# Patient Record
Sex: Male | Born: 1955 | Race: White | Hispanic: No | Marital: Married | Smoking: Never smoker
Health system: Southern US, Community
[De-identification: ages and names within clinical notes are randomized; demographics above are authoritative.]

## PROBLEM LIST (undated history)

## (undated) DIAGNOSIS — M5416 Radiculopathy, lumbar region: Secondary | ICD-10-CM

## (undated) DIAGNOSIS — K22719 Barrett's esophagus with dysplasia, unspecified: Secondary | ICD-10-CM

## (undated) DIAGNOSIS — M4690 Unspecified inflammatory spondylopathy, site unspecified: Secondary | ICD-10-CM

## (undated) DIAGNOSIS — M4316 Spondylolisthesis, lumbar region: Secondary | ICD-10-CM

## (undated) DIAGNOSIS — L409 Psoriasis, unspecified: Secondary | ICD-10-CM

## (undated) DIAGNOSIS — M1990 Unspecified osteoarthritis, unspecified site: Secondary | ICD-10-CM

## (undated) DIAGNOSIS — G629 Polyneuropathy, unspecified: Secondary | ICD-10-CM

## (undated) DIAGNOSIS — K219 Gastro-esophageal reflux disease without esophagitis: Secondary | ICD-10-CM

## (undated) DIAGNOSIS — M2012 Hallux valgus (acquired), left foot: Secondary | ICD-10-CM

## (undated) DIAGNOSIS — L4059 Other psoriatic arthropathy: Secondary | ICD-10-CM

## (undated) DIAGNOSIS — M2550 Pain in unspecified joint: Secondary | ICD-10-CM

## (undated) DIAGNOSIS — M2011 Hallux valgus (acquired), right foot: Secondary | ICD-10-CM

## (undated) DIAGNOSIS — G609 Hereditary and idiopathic neuropathy, unspecified: Secondary | ICD-10-CM

## (undated) DIAGNOSIS — M7751 Other enthesopathy of right foot: Secondary | ICD-10-CM

## (undated) DIAGNOSIS — M5116 Intervertebral disc disorders with radiculopathy, lumbar region: Secondary | ICD-10-CM

## (undated) DIAGNOSIS — M216X9 Other acquired deformities of unspecified foot: Secondary | ICD-10-CM

## (undated) DIAGNOSIS — Z201 Contact with and (suspected) exposure to tuberculosis: Secondary | ICD-10-CM

## (undated) DIAGNOSIS — E538 Deficiency of other specified B group vitamins: Secondary | ICD-10-CM

## (undated) DIAGNOSIS — E785 Hyperlipidemia, unspecified: Secondary | ICD-10-CM

## (undated) DIAGNOSIS — Z973 Presence of spectacles and contact lenses: Secondary | ICD-10-CM

## (undated) HISTORY — PX: TONSILLECTOMY: SUR1361

## (undated) HISTORY — PX: OTHER SURGICAL HISTORY: SHX169

## (undated) HISTORY — PX: BACK SURGERY: SHX140

## (undated) HISTORY — DX: Hyperlipidemia, unspecified: E78.5

## (undated) HISTORY — DX: Psoriasis, unspecified: L40.9

## (undated) HISTORY — PX: LEG SURGERY: SHX1003

## (undated) HISTORY — DX: Unspecified osteoarthritis, unspecified site: M19.90

## (undated) HISTORY — PX: APPENDECTOMY: SHX54

## (undated) HISTORY — PX: VASECTOMY: SHX75

---

## 2004-09-01 ENCOUNTER — Ambulatory Visit: Payer: Self-pay | Admitting: Internal Medicine

## 2005-06-25 ENCOUNTER — Ambulatory Visit: Payer: Self-pay | Admitting: Unknown Physician Specialty

## 2008-07-30 ENCOUNTER — Ambulatory Visit: Payer: Self-pay | Admitting: Dermatology

## 2009-08-26 ENCOUNTER — Ambulatory Visit: Payer: Self-pay | Admitting: Dermatology

## 2010-09-13 ENCOUNTER — Ambulatory Visit: Payer: Self-pay | Admitting: Rheumatology

## 2010-09-18 ENCOUNTER — Ambulatory Visit: Payer: Self-pay | Admitting: Unknown Physician Specialty

## 2010-09-19 LAB — PATHOLOGY REPORT

## 2010-09-21 ENCOUNTER — Ambulatory Visit: Payer: Self-pay | Admitting: Unknown Physician Specialty

## 2011-03-02 ENCOUNTER — Ambulatory Visit: Payer: Self-pay | Admitting: Dermatology

## 2012-10-10 ENCOUNTER — Ambulatory Visit: Payer: Self-pay | Admitting: Dermatology

## 2013-01-30 ENCOUNTER — Ambulatory Visit (INDEPENDENT_AMBULATORY_CARE_PROVIDER_SITE_OTHER): Payer: Managed Care, Other (non HMO) | Admitting: Podiatrist

## 2013-01-30 ENCOUNTER — Ambulatory Visit (INDEPENDENT_AMBULATORY_CARE_PROVIDER_SITE_OTHER): Payer: Managed Care, Other (non HMO)

## 2013-01-30 ENCOUNTER — Ambulatory Visit: Payer: Self-pay | Admitting: Podiatry

## 2013-01-30 ENCOUNTER — Encounter: Payer: Self-pay | Admitting: Podiatrist

## 2013-01-30 VITALS — BP 122/71 | HR 66 | Resp 20 | Ht 72.0 in | Wt 193.0 lb

## 2013-01-30 DIAGNOSIS — D219 Benign neoplasm of connective and other soft tissue, unspecified: Secondary | ICD-10-CM

## 2013-01-30 DIAGNOSIS — M779 Enthesopathy, unspecified: Secondary | ICD-10-CM

## 2013-01-30 DIAGNOSIS — Q6650 Congenital pes planus, unspecified foot: Secondary | ICD-10-CM

## 2013-01-30 DIAGNOSIS — D3610 Benign neoplasm of peripheral nerves and autonomic nervous system, unspecified: Secondary | ICD-10-CM

## 2013-01-30 DIAGNOSIS — R52 Pain, unspecified: Secondary | ICD-10-CM

## 2013-01-30 NOTE — Progress Notes (Signed)
   Subjective:    Patient ID: Larry Russell, male    DOB: July 23, 1955, 57 y.o.   MRN: [id number]  "I get numbness in my toes and it tingles sometime down below the toes."  Foot Pain This is a new (Numbness and tingling MPJ. area B/L) problem. Episode onset: couple years. The problem occurs constantly. The problem has been gradually worsening. Associated symptoms include myalgias. Nothing aggravates the symptoms. Treatments tried: stretch them. The treatment provided mild relief.   Colon presents today complaining of pain to the forefoot region of bilateral feet. He states he gets tingling in his toes and sometimes just behind the toes as he points to the second interspace of bilateral feet. He also states he has arthritis he's had arthritis in his knees and this has done well with injections. He wonders if he has arthritis in his feet as well. Denies any back issues or back pain but does describe nerve type discomfort down to his feet.   Review of Systems  HENT: Positive for tinnitus.   Musculoskeletal: Positive for back pain and myalgias.  Allergic/Immunologic: Positive for food allergies.  All other systems reviewed and are negative.       Objective:   Physical Exam GENERAL APPEARANCE: Alert, conversant. Appropriately groomed. No acute distress.  VASCULAR: Pedal pulses palpable and strong bilateral.  Capillary refill time is immediate to all digits,  Proximal to distal cooling it warm to warm.  Digital hair growth is present bilateral  NEUROLOGIC: sensation is intact epicritically and protectively to 5.07 monofilament at 5/5 sites bilateral.  Some decrease in sensation at the second digit bilaterally is noted. Also nerve type discomfort of the second interspace is noted bilaterally. MUSCULOSKELETAL: acceptable muscle strength, tone and stability bilateral.  Intrinsic muscluature intact bilateral. 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 DERMATOLOGIC: skin color, texture, and turger are within normal limits.  No preulcerative lesions are seen, no interdigital maceration noted.  No open lesions present.  Digital nails are asymptomatic.     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 Dispensed power step inserts to help with his pes planovalgus deformity and I'll see him back in 3 weeks to see if the injection was of any benefit. If there is no improvement consider referral to neurosurgery for workup of possible pinched desk or back problem.  Marlowe Aschoff DPM

## 2013-02-27 ENCOUNTER — Ambulatory Visit: Payer: Managed Care, Other (non HMO) | Admitting: Podiatrist

## 2013-11-18 ENCOUNTER — Ambulatory Visit: Payer: Self-pay | Admitting: Dermatology

## 2014-07-14 DIAGNOSIS — E782 Mixed hyperlipidemia: Secondary | ICD-10-CM | POA: Insufficient documentation

## 2014-07-14 DIAGNOSIS — M1990 Unspecified osteoarthritis, unspecified site: Secondary | ICD-10-CM | POA: Insufficient documentation

## 2014-07-14 DIAGNOSIS — G609 Hereditary and idiopathic neuropathy, unspecified: Secondary | ICD-10-CM | POA: Insufficient documentation

## 2014-07-14 DIAGNOSIS — Z9289 Personal history of other medical treatment: Secondary | ICD-10-CM | POA: Insufficient documentation

## 2015-02-10 ENCOUNTER — Other Ambulatory Visit: Payer: Self-pay | Admitting: Student

## 2015-02-10 DIAGNOSIS — M5442 Lumbago with sciatica, left side: Principal | ICD-10-CM

## 2015-02-10 DIAGNOSIS — M5441 Lumbago with sciatica, right side: Secondary | ICD-10-CM

## 2015-03-02 ENCOUNTER — Ambulatory Visit: Payer: Self-pay

## 2015-03-10 ENCOUNTER — Ambulatory Visit
Admission: RE | Admit: 2015-03-10 | Discharge: 2015-03-10 | Disposition: A | Discharge status: Home / Self Care | Payer: Managed Care, Other (non HMO) | Source: Ambulatory Visit | Attending: Student | Admitting: Student

## 2015-03-10 DIAGNOSIS — M79604 Pain in right leg: Secondary | ICD-10-CM | POA: Diagnosis not present

## 2015-03-10 DIAGNOSIS — M5441 Lumbago with sciatica, right side: Secondary | ICD-10-CM

## 2015-03-10 DIAGNOSIS — M4806 Spinal stenosis, lumbar region: Secondary | ICD-10-CM | POA: Insufficient documentation

## 2015-03-10 DIAGNOSIS — R200 Anesthesia of skin: Secondary | ICD-10-CM | POA: Insufficient documentation

## 2015-03-10 DIAGNOSIS — M5442 Lumbago with sciatica, left side: Secondary | ICD-10-CM

## 2015-03-10 DIAGNOSIS — M5136 Other intervertebral disc degeneration, lumbar region: Secondary | ICD-10-CM | POA: Diagnosis not present

## 2015-03-10 DIAGNOSIS — M545 Low back pain: Secondary | ICD-10-CM | POA: Diagnosis present

## 2015-03-10 DIAGNOSIS — M79605 Pain in left leg: Secondary | ICD-10-CM | POA: Insufficient documentation

## 2015-07-19 DIAGNOSIS — E538 Deficiency of other specified B group vitamins: Secondary | ICD-10-CM | POA: Insufficient documentation

## 2015-08-05 ENCOUNTER — Encounter: Payer: Self-pay | Admitting: Emergency Medicine

## 2015-08-05 ENCOUNTER — Emergency Department
Admission: EM | Admit: 2015-08-05 | Discharge: 2015-08-05 | Disposition: A | Discharge status: Home / Self Care | Payer: Worker's Compensation | Attending: Emergency Medicine | Admitting: Emergency Medicine

## 2015-08-05 DIAGNOSIS — S81811A Laceration without foreign body, right lower leg, initial encounter: Secondary | ICD-10-CM | POA: Diagnosis not present

## 2015-08-05 DIAGNOSIS — W268XXA Contact with other sharp object(s), not elsewhere classified, initial encounter: Secondary | ICD-10-CM | POA: Diagnosis not present

## 2015-08-05 DIAGNOSIS — Y929 Unspecified place or not applicable: Secondary | ICD-10-CM | POA: Diagnosis not present

## 2015-08-05 DIAGNOSIS — Y9389 Activity, other specified: Secondary | ICD-10-CM | POA: Diagnosis not present

## 2015-08-05 DIAGNOSIS — Z79899 Other long term (current) drug therapy: Secondary | ICD-10-CM | POA: Diagnosis not present

## 2015-08-05 DIAGNOSIS — M1990 Unspecified osteoarthritis, unspecified site: Secondary | ICD-10-CM | POA: Diagnosis not present

## 2015-08-05 DIAGNOSIS — E785 Hyperlipidemia, unspecified: Secondary | ICD-10-CM | POA: Diagnosis not present

## 2015-08-05 DIAGNOSIS — Y990 Civilian activity done for income or pay: Secondary | ICD-10-CM | POA: Insufficient documentation

## 2015-08-05 MED ORDER — OXYCODONE-ACETAMINOPHEN 7.5-325 MG PO TABS: 1.0000 | ORAL_TABLET | ORAL | Status: DC | PRN

## 2015-08-05 MED ORDER — OXYCODONE-ACETAMINOPHEN 5-325 MG PO TABS
1.0000 | ORAL_TABLET | Freq: Once | ORAL | Status: AC
  Administered 2015-08-05: 1 via ORAL
  Filled 2015-08-05: qty 1

## 2015-08-05 MED ORDER — TETANUS-DIPHTH-ACELL PERTUSSIS 5-2.5-18.5 LF-MCG/0.5 IM SUSP
0.5000 mL | Freq: Once | INTRAMUSCULAR | Status: AC
  Administered 2015-08-05: 0.5 mL via INTRAMUSCULAR
  Filled 2015-08-05: qty 0.5

## 2015-08-05 MED ORDER — CEPHALEXIN 500 MG PO CAPS: 500.0000 mg | ORAL_CAPSULE | Freq: Four times a day (QID) | ORAL | Status: DC

## 2015-08-05 MED ORDER — BACITRACIN ZINC 500 UNIT/GM EX OINT
TOPICAL_OINTMENT | Freq: Once | CUTANEOUS | Status: AC
  Administered 2015-08-05: 18:00:00 via TOPICAL
  Filled 2015-08-05: qty 0.9

## 2015-08-05 NOTE — ED Notes (Signed)
Reports got cut by a screw at work, lac to right lower leg

## 2015-08-05 NOTE — ED Notes (Signed)
Pt verbalized understanding of discharge instructions. NAD at this time. 

## 2015-08-05 NOTE — Discharge Instructions (Signed)
Laceration Care, Adult  A laceration is a cut that goes through all layers of the skin. The cut also goes into the tissue that is right under the skin. Some cuts heal on their own. Others need to be closed with stitches (sutures), staples, skin adhesive strips, or wound glue. Taking care of your cut lowers your risk of infection and helps your cut to heal better.  HOW TO TAKE CARE OF YOUR CUT  For stitches or staples:  · Keep the wound clean and dry.  · If you were given a bandage (dressing), you should change it at least one time per day or as told by your doctor. You should also change it if it gets wet or dirty.  · Keep the wound completely dry for the first 24 hours or as told by your doctor. After that time, you may take a shower or a bath. However, make sure that the wound is not soaked in water until after the stitches or staples have been removed.  · Clean the wound one time each day or as told by your doctor:    Wash the wound with soap and water.    Rinse the wound with water until all of the soap comes off.    Pat the wound dry with a clean towel. Do not rub the wound.  · After you clean the wound, put a thin layer of antibiotic ointment on it as told by your doctor. This ointment:    Helps to prevent infection.    Keeps the bandage from sticking to the wound.  · Have your stitches or staples removed as told by your doctor.  If your doctor used skin adhesive strips:   · Keep the wound clean and dry.  · If you were given a bandage, you should change it at least one time per day or as told by your doctor. You should also change it if it gets dirty or wet.  · Do not get the skin adhesive strips wet. You can take a shower or a bath, but be careful to keep the wound dry.  · If the wound gets wet, pat it dry with a clean towel. Do not rub the wound.  · Skin adhesive strips fall off on their own. You can trim the strips as the wound heals. Do not remove any strips that are still stuck to the wound. They will  fall off after a while.  If your doctor used wound glue:  · Try to keep your wound dry, but you may briefly wet it in the shower or bath. Do not soak the wound in water, such as by swimming.  · After you take a shower or a bath, gently pat the wound dry with a clean towel. Do not rub the wound.  · Do not do any activities that will make you really sweaty until the skin glue has fallen off on its own.  · Do not apply liquid, cream, or ointment medicine to your wound while the skin glue is still on.  · If you were given a bandage, you should change it at least one time per day or as told by your doctor. You should also change it if it gets dirty or wet.  · If a bandage is placed over the wound, do not let the tape for the bandage touch the skin glue.  · Do not pick at the glue. The skin glue usually stays on for 5-10 days. Then, it   falls off of the skin.  General Instructions   · To help prevent scarring, make sure to cover your wound with sunscreen whenever you are outside after stitches are removed, after adhesive strips are removed, or when wound glue stays in place and the wound is healed. Make sure to wear a sunscreen of at least 30 SPF.  · Take over-the-counter and prescription medicines only as told by your doctor.  · If you were given antibiotic medicine or ointment, take or apply it as told by your doctor. Do not stop using the antibiotic even if your wound is getting better.  · Do not scratch or pick at the wound.  · Keep all follow-up visits as told by your doctor. This is important.  · Check your wound every day for signs of infection. Watch for:    Redness, swelling, or pain.    Fluid, blood, or pus.  · Raise (elevate) the injured area above the level of your heart while you are sitting or lying down, if possible.  GET HELP IF:  · You got a tetanus shot and you have any of these problems at the injection site:    Swelling.    Very bad pain.    Redness.    Bleeding.  · You have a fever.  · A wound that was  closed breaks open.  · You notice a bad smell coming from your wound or your bandage.  · You notice something coming out of the wound, such as wood or glass.  · Medicine does not help your pain.  · You have more redness, swelling, or pain at the site of your wound.  · You have fluid, blood, or pus coming from your wound.  · You notice a change in the color of your skin near your wound.  · You need to change the bandage often because fluid, blood, or pus is coming from the wound.  · You start to have a new rash.  · You start to have numbness around the wound.  GET HELP RIGHT AWAY IF:  · You have very bad swelling around the wound.  · Your pain suddenly gets worse and is very bad.  · You notice painful lumps near the wound or on skin that is anywhere on your body.  · You have a red streak going away from your wound.  · The wound is on your hand or foot and you cannot move a finger or toe like you usually can.  · The wound is on your hand or foot and you notice that your fingers or toes look pale or bluish.     This information is not intended to replace advice given to you by your health care provider. Make sure you discuss any questions you have with your health care provider.     Document Released: 07/25/2007 Document Revised: 06/22/2014 Document Reviewed: 02/01/2014  Elsevier Interactive Patient Education ©2016 [employer]

## 2015-08-05 NOTE — ED Provider Notes (Signed)
[facility] Emergency Department Provider Note    ____________________________________________  Time seen: Approximately 5:26 PM  I have reviewed the triage vital signs and the nursing notes.   HISTORY  Chief Complaint Laceration    HPI Larry Russell is a 60 y.o. male patient for laceration to the right lower leg. Patient states he was cut by a screw at work. Patient state hemorrhaging was controlled with direct pressure. Patient denies loss of sensation or loss of function of the right lower extremity. Patient rates his pain as a 1/10. No palliative measures prior to arrival. Patient tetanus shot is not up-to-date.   Past Medical History  Diagnosis Date  . Arthritis   . Hyperlipidemia     There are no active problems to display for this patient.   Past Surgical History  Procedure Laterality Date  . Appendectomy    . Leg surgery Right   . Tonsillectomy      Current Outpatient Rx  Name  Route  Sig  Dispense  Refill  . calcipotriene (DOVONOX) 0.005 % cream   Topical   Apply 1 application topically daily as needed.          . cephALEXin (KEFLEX) 500 MG capsule   Oral   Take 1 capsule (500 mg total) by mouth 4 (four) times daily.   40 capsule   0   . etodolac (LODINE) 400 MG tablet   Oral   Take 400 mg by mouth as needed.         Marland Kitchen HUMIRA 40 MG/0.8ML injection   Intramuscular   Inject 40 mg into the muscle once a week.          Marland Kitchen oxyCODONE-acetaminophen (PERCOCET) 7.5-325 MG tablet   Oral   Take 1 tablet by mouth every 4 (four) hours as needed for severe pain.   20 tablet   0   . simvastatin (ZOCOR) 40 MG tablet   Oral   Take 40 mg by mouth every other day.           Allergies Celebrex and Shellfish allergy  Family History  Problem Relation Age of Onset  . Cancer Mother   . Heart disease Father     Social History Social History  Substance Use Topics  . Smoking status: Never Smoker   . Smokeless  tobacco: Never Used  . Alcohol Use: No    Review of Systems Constitutional: No fever/chills Eyes: No visual changes. ENT: No sore throat. Cardiovascular: Denies chest pain. Respiratory: Denies shortness of breath. Gastrointestinal: No abdominal pain.  No nausea, no vomiting.  No diarrhea.  No constipation. Genitourinary: Negative for dysuria. Musculoskeletal: Negative for back pain. Skin: Negative for rash.Laceration right lower leg Neurological: Negative for headaches, focal weakness or numbness. Endocrine:Hyperlipidemia Allergic/Immunilogical: Celebrex and shellfish 10-point ROS otherwise negative.  ____________________________________________   PHYSICAL EXAM:  VITAL SIGNS: ED Triage Vitals  Enc Vitals Group     BP 08/05/15 1717 119/76 mmHg     Pulse Rate 08/05/15 1717 85     Resp 08/05/15 1717 18     Temp 08/05/15 1717 98.1 F (36.7 C)     Temp Source 08/05/15 1717 Oral     SpO2 08/05/15 1717 97 %     Weight 08/05/15 1717 196 lb (88.905 kg)     Height 08/05/15 1717 6' (1.829 m)     Head Cir --      Peak Flow --      Pain Score 08/05/15  1718 1     Pain Loc --      Pain Edu? --      Excl. in [place]? --     Constitutional: Alert and oriented. Well appearing and in no acute distress. Eyes: Conjunctivae are normal. PERRL. EOMI. Head: Atraumatic. Nose: No congestion/rhinnorhea. Mouth/Throat: Mucous membranes are moist.  Oropharynx non-erythematous. Neck: No stridor.  No cervical spine tenderness to palpation. Hematological/Lymphatic/Immunilogical: No cervical lymphadenopathy. Cardiovascular: Normal rate, regular rhythm. Grossly normal heart sounds.  Good peripheral circulation. Respiratory: Normal respiratory effort.  No retractions. Lungs CTAB. Gastrointestinal: Soft and nontender. No distention. No abdominal bruits. No CVA tenderness. Musculoskeletal: No lower extremity tenderness nor edema.  No joint effusions. Neurologic:  Normal speech and language. No gross  focal neurologic deficits are appreciated. No gait instability. Skin:  Skin is warm, dry and intact. No rash noted. 8 cm laceration right lower leg Psychiatric: Mood and affect are normal. Speech and behavior are normal.  ____________________________________________   LABS (all labs ordered are listed, but only abnormal results are displayed)  Labs Reviewed - No data to display ____________________________________________  EKG   ____________________________________________  RADIOLOGY   ____________________________________________   PROCEDURES  Procedure(s) performed: LACERATION REPAIR Performed by: Sable Feil Authorized by: Sable Feil Consent: Verbal consent obtained. Risks and benefits: risks, benefits and alternatives were discussed Consent given by: patient Patient identity confirmed: provided demographic data Prepped and Draped in normal sterile fashion Wound explored  Laceration Location: Right lower leg  Laceration Length: 8cm  No Foreign Bodies seen or palpated  Anesthesia: local infiltration of Sensorcaine    Anesthetic total:8 ml  Irrigation method: syringe Amount of cleaning: standard  Skin closure: 3-0 nylon   Number of sutures: 18 Technique: Interrupted Patient tolerance: Patient tolerated the procedure well with no immediate complications.   Critical Care performed: No  ____________________________________________   INITIAL IMPRESSION / ASSESSMENT AND PLAN / ED COURSE  Pertinent labs & imaging results that were available during my care of the patient were reviewed by me and considered in my medical decision making (see chart for details).  Left leg laceration. Patient given discharge Instructions. Patient given a prescription for Keflex and Percocets. Patient advised to have sutures removed in 10 days. Patient [facility]. Patient given a work note for 3  days. ____________________________________________   FINAL CLINICAL IMPRESSION(S) / ED DIAGNOSES  Final diagnoses:  Leg laceration, right, initial encounter      NEW MEDICATIONS STARTED DURING THIS VISIT:  New Prescriptions   CEPHALEXIN (KEFLEX) 500 MG CAPSULE    Take 1 capsule (500 mg total) by mouth 4 (four) times daily.   OXYCODONE-ACETAMINOPHEN (PERCOCET) 7.5-325 MG TABLET    Take 1 tablet by mouth every 4 (four) hours as needed for severe pain.     Note:  This document was prepared using Dragon voice recognition software and may include unintentional dictation errors.    Sable Feil, PA-C 08/05/15 [place], MD 08/05/15 [phone]

## 2015-08-15 ENCOUNTER — Other Ambulatory Visit: Payer: Self-pay | Admitting: Student

## 2015-08-15 DIAGNOSIS — M7582 Other shoulder lesions, left shoulder: Principal | ICD-10-CM

## 2015-08-15 DIAGNOSIS — M7581 Other shoulder lesions, right shoulder: Secondary | ICD-10-CM

## 2015-08-15 DIAGNOSIS — M25512 Pain in left shoulder: Secondary | ICD-10-CM

## 2015-08-15 DIAGNOSIS — M25511 Pain in right shoulder: Secondary | ICD-10-CM

## 2015-08-15 DIAGNOSIS — G8929 Other chronic pain: Secondary | ICD-10-CM

## 2015-09-03 ENCOUNTER — Ambulatory Visit
Admission: RE | Admit: 2015-09-03 | Discharge: 2015-09-03 | Disposition: A | Discharge status: Home / Self Care | Payer: Managed Care, Other (non HMO) | Source: Ambulatory Visit | Attending: Student | Admitting: Student

## 2015-09-03 DIAGNOSIS — G8929 Other chronic pain: Secondary | ICD-10-CM | POA: Diagnosis not present

## 2015-09-03 DIAGNOSIS — M25511 Pain in right shoulder: Secondary | ICD-10-CM

## 2015-09-03 DIAGNOSIS — M25512 Pain in left shoulder: Secondary | ICD-10-CM | POA: Diagnosis present

## 2015-09-03 DIAGNOSIS — M7581 Other shoulder lesions, right shoulder: Secondary | ICD-10-CM

## 2015-09-03 DIAGNOSIS — M7582 Other shoulder lesions, left shoulder: Secondary | ICD-10-CM

## 2015-09-03 DIAGNOSIS — S46811A Strain of other muscles, fascia and tendons at shoulder and upper arm level, right arm, initial encounter: Secondary | ICD-10-CM | POA: Insufficient documentation

## 2015-09-03 DIAGNOSIS — M67814 Other specified disorders of tendon, left shoulder: Secondary | ICD-10-CM | POA: Insufficient documentation

## 2015-11-11 ENCOUNTER — Ambulatory Visit
Admission: RE | Admit: 2015-11-11 | Discharge: 2015-11-11 | Disposition: A | Discharge status: Home / Self Care | Payer: Managed Care, Other (non HMO) | Source: Ambulatory Visit | Attending: Dermatology | Admitting: Dermatology

## 2015-11-11 ENCOUNTER — Other Ambulatory Visit: Payer: Self-pay | Admitting: Dermatology

## 2015-11-11 DIAGNOSIS — L400 Psoriasis vulgaris: Secondary | ICD-10-CM

## 2015-11-11 DIAGNOSIS — Z79899 Other long term (current) drug therapy: Secondary | ICD-10-CM | POA: Insufficient documentation

## 2016-07-08 ENCOUNTER — Ambulatory Visit
Admission: EM | Admit: 2016-07-08 | Discharge: 2016-07-08 | Disposition: A | Discharge status: Home / Self Care | Payer: Managed Care, Other (non HMO) | Attending: Family Medicine | Admitting: Family Medicine

## 2016-07-08 ENCOUNTER — Encounter: Payer: Self-pay | Admitting: Emergency Medicine

## 2016-07-08 DIAGNOSIS — N451 Epididymitis: Secondary | ICD-10-CM

## 2016-07-08 MED ORDER — LEVOFLOXACIN 500 MG PO TABS: 500.0000 mg | ORAL_TABLET | Freq: Every day | ORAL | 0 refills | Status: DC

## 2016-07-08 NOTE — ED Provider Notes (Signed)
[facility]    CSN: [id number] Arrival date & time: 07/08/16  1227  History   Chief Complaint Chief Complaint  Patient presents with  . Testicle Pain    left   HPI  61 year old male with a past medical history of psoriatic arthritis, psoriasis, hyper lipidemia presents with the above complaint.  Patient reports he developed to signal pain this morning. Mild to moderate. Associated swelling and tenderness of the area around the left testicle. No recent trauma. He does lift heavily but denies any bulge or pain in the groin. No associated fever or chills. Denies any dysuria. He does have some nocturia which is likely related to BPH. No known exacerbating or relieving factors. No other complaints or concerns at this time.  Past Medical History:  Diagnosis Date  . Arthritis   . Hyperlipidemia    There are no active problems to display for this patient.  Past Surgical History:  Procedure Laterality Date  . APPENDECTOMY    . LEG SURGERY Right   . TONSILLECTOMY      Home Medications    Prior to Admission medications   Medication Sig Start Date End Date Taking? Authorizing Provider  Apremilast (OTEZLA PO) Take 40 mg by mouth daily.   Yes [provider]  calcipotriene (DOVONOX) 0.005 % cream Apply 1 application topically daily as needed.  01/16/13   [provider]  etodolac (LODINE) 400 MG tablet Take 400 mg by mouth as needed.    [provider]  HUMIRA 40 MG/0.8ML injection Inject 40 mg into the muscle once a week.  01/12/13   [provider]  levofloxacin (LEVAQUIN) 500 MG tablet Take 1 tablet (500 mg total) by mouth daily. 07/08/16   Coral Spikes, DO    Family History Family History  Problem Relation Age of Onset  . Cancer Mother   . Heart disease Father     Social History Social History  Substance Use Topics  . Smoking status: Never Smoker  . Smokeless tobacco: Never Used  . Alcohol use No     Allergies     Celebrex [celecoxib] and Shellfish allergy   Review of Systems Review of Systems  Constitutional: Negative for fever.  Genitourinary: Positive for testicular pain.  All other systems reviewed and are negative.  Physical Exam Triage Vital Signs ED Triage Vitals  Enc Vitals Group     BP 07/08/16 1415 136/90     Pulse Rate 07/08/16 1415 71     Resp 07/08/16 1415 16     Temp 07/08/16 1415 98.5 F (36.9 C)     Temp Source 07/08/16 1415 Oral     SpO2 07/08/16 1415 99 %     Weight 07/08/16 1416 195 lb (88.5 kg)     Height 07/08/16 1416 6' (1.829 m)     Head Circumference --      Peak Flow --      Pain Score 07/08/16 1416 1     Pain Loc --      Pain Edu? --      Excl. in [place]? --    Updated Vital Signs BP 136/90 (BP Location: Left Arm)   Pulse 71   Temp 98.5 F (36.9 C) (Oral)   Resp 16   Ht 6' (1.829 m)   Wt 195 lb (88.5 kg)   SpO2 99%   BMI 26.45 kg/m   Physical Exam  Constitutional: He is oriented to person, place, and time. He appears well-developed. No  distress.  HENT:  Head: Normocephalic and atraumatic.  Eyes: Conjunctivae are normal. No scleral icterus.  Cardiovascular: Normal rate and regular rhythm.   Pulmonary/Chest: Effort normal and breath sounds normal.  Abdominal: Soft. He exhibits no distension. There is no tenderness.  Genitourinary:  Genitourinary Comments: Patient with tenderness at the epididymis. Swelling noted in this region. No hernia appreciated.  Musculoskeletal: Normal range of motion.  Neurological: He is alert and oriented to person, place, and time.  Skin: Skin is warm. No rash noted.  Psychiatric: He has a normal mood and affect.  Vitals reviewed.  UC Treatments / Results  Labs (all labs ordered are listed, but only abnormal results are displayed) Labs Reviewed - No data to display  EKG  EKG Interpretation None       Radiology No results found.  Procedures Procedures (including critical care time)  Medications Ordered in  UC Medications - No data to display   Initial Impression / Assessment and Plan / UC Course  I have reviewed the triage vital signs and the nursing notes.  Pertinent labs & imaging results that were available during my care of the patient were reviewed by me and considered in my medical decision making (see chart for details).   61 year old male presents with epididymitis. History and physical exam not consistent with testicular torsion. Treating with Levaquin. Follow-up with his primary if he fails to improve or worsens.  Final Clinical Impressions(s) / UC Diagnoses   Final diagnoses:  Epididymitis    New Prescriptions New Prescriptions   LEVOFLOXACIN (LEVAQUIN) 500 MG TABLET    Take 1 tablet (500 mg total) by mouth daily.     Coral Spikes, [place] 07/08/16 1514

## 2016-07-08 NOTE — Discharge Instructions (Signed)
Antibiotic as prescribed.  Take care  Dr. Virgen Belland  

## 2016-07-08 NOTE — ED Triage Notes (Signed)
Patient c/o left testicle and groin pain that started this morning.  Patient denies difficulty or pain when urinating.

## 2017-02-08 ENCOUNTER — Ambulatory Visit
Admission: RE | Admit: 2017-02-08 | Discharge: 2017-02-08 | Disposition: A | Discharge status: Home / Self Care | Payer: Managed Care, Other (non HMO) | Source: Ambulatory Visit | Attending: Dermatology | Admitting: Dermatology

## 2017-02-08 ENCOUNTER — Other Ambulatory Visit: Payer: Self-pay | Admitting: Dermatology

## 2017-02-08 DIAGNOSIS — L400 Psoriasis vulgaris: Secondary | ICD-10-CM

## 2017-02-08 DIAGNOSIS — Z79899 Other long term (current) drug therapy: Secondary | ICD-10-CM | POA: Diagnosis not present

## 2018-08-27 ENCOUNTER — Ambulatory Visit
Admission: RE | Admit: 2018-08-27 | Discharge: 2018-08-27 | Disposition: A | Discharge status: Home / Self Care | Payer: Managed Care, Other (non HMO) | Source: Ambulatory Visit | Attending: Dermatology | Admitting: Dermatology

## 2018-08-27 ENCOUNTER — Other Ambulatory Visit: Payer: Self-pay | Admitting: Internal Medicine

## 2018-08-27 ENCOUNTER — Other Ambulatory Visit: Payer: Self-pay | Admitting: Dermatology

## 2018-08-27 ENCOUNTER — Other Ambulatory Visit: Payer: Self-pay

## 2018-08-27 DIAGNOSIS — L400 Psoriasis vulgaris: Secondary | ICD-10-CM | POA: Diagnosis not present

## 2018-08-27 DIAGNOSIS — Z201 Contact with and (suspected) exposure to tuberculosis: Secondary | ICD-10-CM | POA: Insufficient documentation

## 2018-08-27 DIAGNOSIS — G8929 Other chronic pain: Secondary | ICD-10-CM

## 2018-09-10 ENCOUNTER — Ambulatory Visit
Admission: RE | Admit: 2018-09-10 | Discharge: 2018-09-10 | Disposition: A | Discharge status: Home / Self Care | Payer: Managed Care, Other (non HMO) | Source: Ambulatory Visit | Attending: Internal Medicine | Admitting: Internal Medicine

## 2018-09-10 ENCOUNTER — Other Ambulatory Visit: Payer: Self-pay

## 2018-09-10 DIAGNOSIS — M5442 Lumbago with sciatica, left side: Secondary | ICD-10-CM | POA: Insufficient documentation

## 2018-09-10 DIAGNOSIS — G8929 Other chronic pain: Secondary | ICD-10-CM | POA: Insufficient documentation

## 2018-09-10 DIAGNOSIS — M5441 Lumbago with sciatica, right side: Secondary | ICD-10-CM | POA: Diagnosis present

## 2018-11-18 DIAGNOSIS — M5116 Intervertebral disc disorders with radiculopathy, lumbar region: Secondary | ICD-10-CM | POA: Insufficient documentation

## 2019-05-13 ENCOUNTER — Other Ambulatory Visit: Payer: Self-pay

## 2019-05-13 ENCOUNTER — Encounter: Payer: Self-pay | Admitting: Dermatology

## 2019-05-13 ENCOUNTER — Ambulatory Visit: Payer: Medicare HMO | Admitting: Dermatology

## 2019-05-13 DIAGNOSIS — D2230 Melanocytic nevi of unspecified part of face: Secondary | ICD-10-CM

## 2019-05-13 DIAGNOSIS — L814 Other melanin hyperpigmentation: Secondary | ICD-10-CM

## 2019-05-13 DIAGNOSIS — L578 Other skin changes due to chronic exposure to nonionizing radiation: Secondary | ICD-10-CM | POA: Diagnosis not present

## 2019-05-13 DIAGNOSIS — L821 Other seborrheic keratosis: Secondary | ICD-10-CM | POA: Diagnosis not present

## 2019-05-13 DIAGNOSIS — D229 Melanocytic nevi, unspecified: Secondary | ICD-10-CM

## 2019-05-13 DIAGNOSIS — L409 Psoriasis, unspecified: Secondary | ICD-10-CM

## 2019-05-13 DIAGNOSIS — D1800 Hemangioma unspecified site: Secondary | ICD-10-CM

## 2019-05-13 DIAGNOSIS — D225 Melanocytic nevi of trunk: Secondary | ICD-10-CM

## 2019-05-13 MED ORDER — CLOBETASOL PROPIONATE 0.05 % EX SOLN: CUTANEOUS | 6 refills | Status: DC

## 2019-05-13 NOTE — Progress Notes (Signed)
   Follow-Up Visit   Subjective  Larry Russell is a 64 y.o. male who presents for the following: Psoriasis (patient currently using Humira and Calcipotriene cream. He had a change in insurance and was off of Humira for about 3 months. He had mild flares of psoriasis during that time, but noticed a significant difference (worse) in his arthritis. Patient c/o lower back pain. Patient has tried and failed [place] and [place]  The following portions of the chart were reviewed this encounter and updated as appropriate:     Review of Systems: No other skin or systemic complaints.  Objective  Well appearing patient in no apparent distress; mood and affect are within normal limits.  A focused examination was performed including trunk and extremities. Relevant physical exam findings are noted in the Assessment and Plan.  Objective  Face, trunk, extremities: Diffuse scaly erythematous macules with underlying dyspigmentation.   Objective  Trunk, extremities: Red papules.   Objective  Face, trunk, extremities: Scattered tan macules.   Objective  Face, trunk, extremities: Tan-brown and/or pink-flesh-colored symmetric macules and papules.   Objective  legs, arms, scalp, back: Some mild thin pink plaques on the legs. Scalp, arms, and trunk clear.  Objective  Trunk, extremities, face: Stuck-on, waxy, tan-brown papule or plaque --Discussed benign etiology and prognosis.   Assessment & Plan  Actinic skin damage Face, trunk, extremities  Recommend daily broad spectrum sunscreen SPF 30+ to sun-exposed areas, reapply every 2 hours as needed. Call for new or changing lesions.   Hemangioma, unspecified site Trunk, extremities  Benign, observe.    L[place]igines Face, trunk, extremities  Benign, observe.    [place]evus Face, trunk, extremities  Benign-appearing.  Observation.  Call clinic for new or changing moles.  Recommend daily use of broad spectrum spf 30+ sunscreen to sun-exposed  areas.    Psoriasis legs, arms, scalp, back  With Psoriatic arthritis -  Continue Humira SQ QOW, Calcipotriene sol. QD to the scalp PRN, and Calcipotriene cream QD-BID PRN. Patient having labs drawn by his PCP in July, but will need a chest x-ray at the next visit. Continue NBUVB at home light - contact the manufacturer to make a request to our office so he can input the code to start the machine. Only use to active psoriasis areas.  clobetasol (TEMOVATE) 0.05 % external solution - legs, arms, scalp, back  Seborrheic keratosis Trunk, extremities, face  Benign, observe.    Return in about 6 months (around 11/13/2019) for psoriasis f/u - Humira, Calcipotriene.   Tanja Port, MD, am acting as scribe for Sarina Ser, MD .

## 2019-10-28 ENCOUNTER — Encounter: Payer: Self-pay | Admitting: Dermatology

## 2019-10-28 ENCOUNTER — Ambulatory Visit
Admission: RE | Admit: 2019-10-28 | Discharge: 2019-10-28 | Disposition: A | Discharge status: Home / Self Care | Payer: Medicare HMO | Source: Ambulatory Visit | Attending: Dermatology | Admitting: Dermatology

## 2019-10-28 ENCOUNTER — Ambulatory Visit: Payer: Medicare HMO | Admitting: Dermatology

## 2019-10-28 ENCOUNTER — Ambulatory Visit
Admission: RE | Admit: 2019-10-28 | Discharge: 2019-10-28 | Disposition: A | Discharge status: Home / Self Care | Payer: Medicare HMO | Attending: Dermatology | Admitting: Dermatology

## 2019-10-28 ENCOUNTER — Other Ambulatory Visit: Payer: Self-pay

## 2019-10-28 ENCOUNTER — Telehealth: Payer: Self-pay

## 2019-10-28 DIAGNOSIS — M1990 Unspecified osteoarthritis, unspecified site: Secondary | ICD-10-CM | POA: Diagnosis not present

## 2019-10-28 DIAGNOSIS — D229 Melanocytic nevi, unspecified: Secondary | ICD-10-CM | POA: Diagnosis not present

## 2019-10-28 DIAGNOSIS — L409 Psoriasis, unspecified: Secondary | ICD-10-CM | POA: Insufficient documentation

## 2019-10-28 DIAGNOSIS — L570 Actinic keratosis: Secondary | ICD-10-CM | POA: Diagnosis not present

## 2019-10-28 DIAGNOSIS — L578 Other skin changes due to chronic exposure to nonionizing radiation: Secondary | ICD-10-CM

## 2019-10-28 DIAGNOSIS — L4050 Arthropathic psoriasis, unspecified: Secondary | ICD-10-CM

## 2019-10-28 DIAGNOSIS — L814 Other melanin hyperpigmentation: Secondary | ICD-10-CM

## 2019-10-28 MED ORDER — MOMETASONE FUROATE 0.1 % EX SOLN: Freq: Every day | CUTANEOUS | 0 refills | Status: DC

## 2019-10-28 MED ORDER — TALTZ 80 MG/ML ~~LOC~~ SOAJ: 80.0000 mg | SUBCUTANEOUS | 0 refills | Status: DC

## 2019-10-28 MED ORDER — TALTZ 80 MG/ML ~~LOC~~ SOAJ: 80.0000 mg | Freq: Once | SUBCUTANEOUS | 0 refills | Status: AC

## 2019-10-28 MED ORDER — TALTZ 80 MG/ML ~~LOC~~ SOAJ: 80.0000 mg | SUBCUTANEOUS | 1 refills | Status: DC

## 2019-10-28 MED ORDER — TALTZ 80 MG/ML ~~LOC~~ SOAJ: 160.0000 mg | SUBCUTANEOUS | 0 refills | Status: DC

## 2019-10-28 NOTE — Progress Notes (Signed)
Follow-Up Visit   Subjective  Larry Russell is a 64 y.o. male who presents for the following: Psoriasis (and PA, scalp, body, Humira 40mg  sq injections qowk, Clobetasol sol prn flares, Calciportriene cr prn, pt has neuropathy in feet and pt wants to discuss if he should continue Humira).  The following portions of the chart were reviewed this encounter and updated as appropriate:  Tobacco  Allergies  Meds  Problems  Med Hx  Surg Hx  Fam Hx     Review of Systems:  No other skin or systemic complaints except as noted in HPI or Assessment and Plan.  Objective  Well appearing patient in no apparent distress; mood and affect are within normal limits.  All skin waist up examined.  Objective  Scalp: Plaque of L lower leg, R leg in burn scar,   Objective  L forehead x 1: Pink scaly macules    Assessment & Plan    Melanocytic Nevi - Tan-brown and/or pink-flesh-colored symmetric macules and papules - Benign appearing on exam today - Observation - Call clinic for new or changing moles - Recommend daily use of broad spectrum spf 30+ sunscreen to sun-exposed areas.   Actinic Damage - diffuse scaly erythematous macules with underlying dyspigmentation - Recommend daily broad spectrum sunscreen SPF 30+ to sun-exposed areas, reapply every 2 hours as needed.  - Call for new or changing lesions.  Lentigines - Scattered tan macules - Discussed due to sun exposure - Benign, observe - Call for any changes  Psoriasis of skin and psoriatic arthritis-severe.  Patient has been on Humira for many years.  He has developed neuropathy and wonders if it could be due to Humira.  There are some neurologic potential side effects but I am not seeing specifically that it is neuropathy.  Nevertheless, we can change him to Donnetta Hail should cover his skin psoriasis and psoriatic arthritis well and see if his neuropathy improved off of Humira. Scalp; legs trunk joints  With Psoriatic Arthritis and  Osteoarthritis  Pt has neuropathy and has tried gabapentin in past and could not tolerate No report of Neuropathy is found in people who take Taltz per clinical study. Information on Taltz given to pt  No hx of inflammatory bowl disease Labs from 09/01/19 reviewed. Chest X ray ordered  Start Mometasone lotion qd up to 5d/wk aa scalp and body Cont Calcipotriene cream qd aa psoriasis Cont Humira 40mg  sq injections qowk  Pending chest x ray, will send in [place] and pending approval pt will d/c Humira and start Taltz. Recent lab reviewed and is okay Other Related Procedures DG Chest 2 View  Reordered Medications mometasone (ELOCON) 0.1 % lotion  Other Related Medications clobetasol (TEMOVATE) 0.05 % external solution  AK (actinic keratosis) L forehead x 1  Destruction of lesion - L forehead x 1 Complexity: simple   Destruction method: cryotherapy   Informed consent: discussed and consent obtained   Timeout:  patient name, date of birth, surgical site, and procedure verified Lesion destroyed using liquid nitrogen: Yes   Region frozen until ice ball extended beyond lesion: Yes   Outcome: patient tolerated procedure well with no complications   Post-procedure details: wound care instructions given    Return in about 3 months (around 01/27/2020) for psoriasis .  I, Othelia Pulling, RMA, am acting as scribe for Sarina Ser, MD .  Documentation: I have reviewed the above documentation for accuracy and completeness, and I agree with the above.  Sarina Ser, MD

## 2019-10-28 NOTE — Telephone Encounter (Signed)
Patient informed of x-ray results. According to his most recent office visit will send in [place] to [place] to see approved by insurance.

## 2019-10-28 NOTE — Telephone Encounter (Signed)
-----   Message from Ralene Bathe, MD sent at 10/28/2019  3:37 PM EDT ----- CXR is OK - no abnormality Continue Biologic treatment for Psoriasis

## 2019-11-09 ENCOUNTER — Telehealth: Payer: Self-pay

## 2019-11-09 MED ORDER — HUMIRA (2 PEN) 40 MG/0.4ML ~~LOC~~ AJKT: 40.0000 mg | AUTO-INJECTOR | SUBCUTANEOUS | 5 refills | Status: DC

## 2019-11-09 NOTE — Telephone Encounter (Signed)
OK, we will continue Humira (and not start [place]) and discuss at next visit. Please send in Humira for pt.

## 2019-11-09 NOTE — Telephone Encounter (Signed)
Patient called and left message that he does not want to go on Taltz. He would like to stay on Humira therapy.

## 2019-11-09 NOTE — Telephone Encounter (Signed)
Humira e-scripted in per patient's request.

## 2020-01-27 ENCOUNTER — Ambulatory Visit: Payer: Medicare HMO | Admitting: Dermatology

## 2020-03-23 ENCOUNTER — Other Ambulatory Visit: Payer: Self-pay

## 2020-03-23 DIAGNOSIS — L409 Psoriasis, unspecified: Secondary | ICD-10-CM

## 2020-03-23 MED ORDER — HUMIRA (2 PEN) 40 MG/0.4ML ~~LOC~~ AJKT: 40.0000 mg | AUTO-INJECTOR | SUBCUTANEOUS | 5 refills | Status: DC

## 2020-03-24 ENCOUNTER — Other Ambulatory Visit: Payer: Self-pay

## 2020-03-24 MED ORDER — HUMIRA (2 SYRINGE) 40 MG/0.4ML ~~LOC~~ PSKT: 40.0000 mg | PREFILLED_SYRINGE | SUBCUTANEOUS | 2 refills | Status: DC

## 2020-03-24 NOTE — Progress Notes (Signed)
Humira RX RF

## 2020-04-28 ENCOUNTER — Ambulatory Visit: Payer: Medicare HMO | Admitting: Dermatology

## 2020-04-28 ENCOUNTER — Other Ambulatory Visit: Payer: Self-pay

## 2020-04-28 DIAGNOSIS — L400 Psoriasis vulgaris: Secondary | ICD-10-CM | POA: Diagnosis not present

## 2020-04-28 MED ORDER — MOMETASONE FUROATE 0.1 % EX CREA
1.0000 | TOPICAL_CREAM | Freq: Every day | CUTANEOUS | 11 refills | Status: DC | PRN
Start: 2020-04-28 — End: 2021-08-02

## 2020-04-28 MED ORDER — MOMETASONE FUROATE 0.1 % EX SOLN: CUTANEOUS | 2 refills | Status: DC

## 2020-04-28 NOTE — Progress Notes (Signed)
   Follow-Up Visit   Subjective  Larry Russell is a 65 y.o. male who presents for the following: Psoriasis (6 months f/u psoriatic arthritis pt taking Humira injection qowk, psoriasis flaring on the scalp and lower legs ). Pt feel his  arthritis is getting worse Humira is no longer helping arthritis.  The following portions of the chart were reviewed this encounter and updated as appropriate:   Tobacco  Allergies  Meds  Problems  Med Hx  Surg Hx  Fam Hx     Review of Systems:  No other skin or systemic complaints except as noted in HPI or Assessment and Plan.  Objective  Well appearing patient in no apparent distress; mood and affect are within normal limits.  A focused examination was performed including face, legs, scalp . Relevant physical exam findings are noted in the Assessment and Plan.  Objective  exts, trunk: Well-demarcated erythematous papules/plaques with silvery scale at R leg within a scar, a smaller patch on the L leg, some plaques on scalp   BSA 5% on current treatment Humira    Assessment & Plan  Plaque psoriasis (with some type of arthritis) exts, trunk; scalp Psoriasis with possible osteoarthritis or psoriatic arthritis-  pt with more joint pain while taking Humira  Discussed with pt we will refer him to rheumatologist to determine which arthritis this could be (Osteoarthritis vs Psoriatic Arthritis vs Both), will refer to [facility] rheumatologist   Psoriasis is a chronic non-curable, but treatable genetic/hereditary disease that may have other systemic features affecting other organ systems such as joints (Psoriatic Arthritis). It is associated with an increased risk of inflammatory bowel disease, heart disease, non-alcoholic fatty liver disease, and depression.     Cont Humira injection every other week  Start Mometasone cream apply to affected areas on legs 5 nights a week Start Mometasone lotion apply to scalp 5 nights a week   Pending  rheumatology notes we may consider Cosentyx, Stelara, Taltz, or adding Otezla to the current treatment   Other Related Procedures Ambulatory referral to Rheumatology  Ordered Medications: mometasone (ELOCON) 0.1 % cream mometasone (ELOCON) 0.1 % lotion  Return in about 4 months (around 08/28/2020) for Psoriasis .  IMarye Round, CMA, am acting as scribe for Sarina Ser, MD .  Documentation: I have reviewed the above documentation for accuracy and completeness, and I agree with the above.  Sarina Ser, MD

## 2020-05-09 ENCOUNTER — Encounter: Payer: Self-pay | Admitting: Dermatology

## 2020-07-16 ENCOUNTER — Emergency Department: Payer: Medicare HMO

## 2020-07-16 ENCOUNTER — Encounter: Payer: Self-pay | Admitting: Emergency Medicine

## 2020-07-16 ENCOUNTER — Other Ambulatory Visit: Payer: Self-pay

## 2020-07-16 ENCOUNTER — Inpatient Hospital Stay
Admission: EM | Admit: 2020-07-16 | Discharge: 2020-07-19 | DRG: 552 | Disposition: A | Discharge status: Home Health | Payer: Medicare HMO | Attending: Internal Medicine | Admitting: Internal Medicine

## 2020-07-16 DIAGNOSIS — T380X5A Adverse effect of glucocorticoids and synthetic analogues, initial encounter: Secondary | ICD-10-CM | POA: Diagnosis not present

## 2020-07-16 DIAGNOSIS — E785 Hyperlipidemia, unspecified: Secondary | ICD-10-CM | POA: Diagnosis present

## 2020-07-16 DIAGNOSIS — Z79899 Other long term (current) drug therapy: Secondary | ICD-10-CM

## 2020-07-16 DIAGNOSIS — M5416 Radiculopathy, lumbar region: Secondary | ICD-10-CM

## 2020-07-16 DIAGNOSIS — M25551 Pain in right hip: Secondary | ICD-10-CM | POA: Diagnosis present

## 2020-07-16 DIAGNOSIS — L409 Psoriasis, unspecified: Secondary | ICD-10-CM | POA: Diagnosis present

## 2020-07-16 DIAGNOSIS — M545 Low back pain, unspecified: Secondary | ICD-10-CM

## 2020-07-16 DIAGNOSIS — D72829 Elevated white blood cell count, unspecified: Secondary | ICD-10-CM | POA: Diagnosis not present

## 2020-07-16 DIAGNOSIS — Z8249 Family history of ischemic heart disease and other diseases of the circulatory system: Secondary | ICD-10-CM

## 2020-07-16 DIAGNOSIS — M4726 Other spondylosis with radiculopathy, lumbar region: Secondary | ICD-10-CM | POA: Diagnosis not present

## 2020-07-16 DIAGNOSIS — Z20822 Contact with and (suspected) exposure to covid-19: Secondary | ICD-10-CM | POA: Diagnosis present

## 2020-07-16 DIAGNOSIS — M48061 Spinal stenosis, lumbar region without neurogenic claudication: Secondary | ICD-10-CM | POA: Diagnosis present

## 2020-07-16 LAB — COMPREHENSIVE METABOLIC PANEL
ALT: 36 U/L (ref 0–44)
AST: 26 U/L (ref 15–41)
Albumin: 4.3 g/dL (ref 3.5–5.0)
Alkaline Phosphatase: 57 U/L (ref 38–126)
Anion gap: 9 (ref 5–15)
BUN: 18 mg/dL (ref 8–23)
CO2: 25 mmol/L (ref 22–32)
Calcium: 9.3 mg/dL (ref 8.9–10.3)
Chloride: 104 mmol/L (ref 98–111)
Creatinine, Ser: 0.92 mg/dL (ref 0.61–1.24)
GFR, Estimated: >60 mL/min (ref >60)
Glucose, Bld: 106 mg/dL — ABNORMAL HIGH (ref 70–99)
Potassium: 3.7 mmol/L (ref 3.5–5.1)
Sodium: 138 mmol/L (ref 135–145)
Total Bilirubin: 0.9 mg/dL (ref 0.3–1.2)
Total Protein: 8.3 g/dL — ABNORMAL HIGH (ref 6.5–8.1)

## 2020-07-16 LAB — CBC WITH DIFFERENTIAL/PLATELET
Abs Immature Granulocytes: 0.02 10*3/uL (ref 0.00–0.07)
Basophils Absolute: 0 10*3/uL (ref 0.0–0.1)
Basophils Relative: 0 %
Eosinophils Absolute: 0 10*3/uL (ref 0.0–0.5)
Eosinophils Relative: 0 %
HCT: 41.9 % (ref 39.0–52.0)
Hemoglobin: 14.7 g/dL (ref 13.0–17.0)
Immature Granulocytes: 0 %
Lymphocytes Relative: 33 %
Lymphs Abs: 3 10*3/uL (ref 0.7–4.0)
MCH: 32.5 pg (ref 26.0–34.0)
MCHC: 35.1 g/dL (ref 30.0–36.0)
MCV: 92.7 fL (ref 80.0–100.0)
Monocytes Absolute: 0.6 10*3/uL (ref 0.1–1.0)
Monocytes Relative: 7 %
Neutro Abs: 5.5 10*3/uL (ref 1.7–7.7)
Neutrophils Relative %: 60 %
Platelets: 209 10*3/uL (ref 150–400)
RBC: 4.52 MIL/uL (ref 4.22–5.81)
RDW: 12.9 % (ref 11.5–15.5)
WBC: 9.3 10*3/uL (ref 4.0–10.5)
nRBC: 0 % (ref 0.0–0.2)

## 2020-07-16 LAB — RESP PANEL BY RT-PCR (FLU A&B, COVID) ARPGX2
Influenza A by PCR: NEGATIVE
Influenza B by PCR: NEGATIVE
SARS Coronavirus 2 by RT PCR: NEGATIVE

## 2020-07-16 MED ORDER — ORPHENADRINE CITRATE 30 MG/ML IJ SOLN
60.0000 mg | Freq: Once | INTRAMUSCULAR | Status: AC
  Administered 2020-07-16: 60 mg via INTRAVENOUS
  Filled 2020-07-16: qty 2

## 2020-07-16 MED ORDER — ACETAMINOPHEN 650 MG RE SUPP: 650.0000 mg | Freq: Four times a day (QID) | RECTAL | Status: DC | PRN

## 2020-07-16 MED ORDER — ONDANSETRON HCL 4 MG PO TABS: 4.0000 mg | ORAL_TABLET | Freq: Four times a day (QID) | ORAL | Status: DC | PRN

## 2020-07-16 MED ORDER — SODIUM CHLORIDE 0.9 % IV SOLN: 250.0000 mL | INTRAVENOUS | Status: DC | PRN

## 2020-07-16 MED ORDER — ACETAMINOPHEN 325 MG PO TABS: 650.0000 mg | ORAL_TABLET | Freq: Four times a day (QID) | ORAL | Status: DC | PRN

## 2020-07-16 MED ORDER — MORPHINE SULFATE (PF) 2 MG/ML IV SOLN
2.0000 mg | INTRAVENOUS | Status: DC | PRN
  Administered 2020-07-16 – 2020-07-18 (×7): 2 mg via INTRAVENOUS
  Filled 2020-07-16 (×7): qty 1

## 2020-07-16 MED ORDER — FENTANYL CITRATE (PF) 100 MCG/2ML IJ SOLN
50.0000 ug | Freq: Once | INTRAMUSCULAR | Status: AC
  Administered 2020-07-16: 50 ug via INTRAVENOUS
  Filled 2020-07-16: qty 2

## 2020-07-16 MED ORDER — MORPHINE SULFATE (PF) 4 MG/ML IV SOLN
4.0000 mg | Freq: Once | INTRAVENOUS | Status: AC
Start: 2020-07-16 — End: 2020-07-16
  Administered 2020-07-16: 4 mg via INTRAVENOUS
  Filled 2020-07-16: qty 1

## 2020-07-16 MED ORDER — CYCLOBENZAPRINE HCL 10 MG PO TABS
10.0000 mg | ORAL_TABLET | Freq: Three times a day (TID) | ORAL | Status: DC
  Administered 2020-07-16 – 2020-07-18 (×6): 10 mg via ORAL
  Filled 2020-07-16 (×6): qty 1

## 2020-07-16 MED ORDER — METHYLPREDNISOLONE SODIUM SUCC 125 MG IJ SOLR
60.0000 mg | Freq: Every day | INTRAMUSCULAR | Status: DC
  Administered 2020-07-16 – 2020-07-19 (×4): 60 mg via INTRAVENOUS
  Filled 2020-07-16 (×4): qty 2

## 2020-07-16 MED ORDER — KETOROLAC TROMETHAMINE 30 MG/ML IJ SOLN
30.0000 mg | Freq: Once | INTRAMUSCULAR | Status: AC
  Administered 2020-07-16: 30 mg via INTRAVENOUS
  Filled 2020-07-16: qty 1

## 2020-07-16 MED ORDER — ONDANSETRON HCL 4 MG/2ML IJ SOLN
4.0000 mg | Freq: Once | INTRAMUSCULAR | Status: AC
  Administered 2020-07-16: 4 mg via INTRAVENOUS
  Filled 2020-07-16: qty 2

## 2020-07-16 MED ORDER — SODIUM CHLORIDE 0.9% FLUSH
3.0000 mL | INTRAVENOUS | Status: DC | PRN
  Administered 2020-07-18: 3 mL via INTRAVENOUS

## 2020-07-16 MED ORDER — PANTOPRAZOLE SODIUM 40 MG IV SOLR
40.0000 mg | INTRAVENOUS | Status: DC
  Administered 2020-07-16 – 2020-07-17 (×2): 40 mg via INTRAVENOUS
  Filled 2020-07-16 (×2): qty 40

## 2020-07-16 MED ORDER — ONDANSETRON HCL 4 MG/2ML IJ SOLN: 4.0000 mg | Freq: Four times a day (QID) | INTRAMUSCULAR | Status: DC | PRN

## 2020-07-16 MED ORDER — SODIUM CHLORIDE 0.9% FLUSH
3.0000 mL | Freq: Two times a day (BID) | INTRAVENOUS | Status: DC
  Administered 2020-07-16 – 2020-07-19 (×7): 3 mL via INTRAVENOUS

## 2020-07-16 MED ORDER — LIDOCAINE 5 % EX PTCH
1.0000 | MEDICATED_PATCH | CUTANEOUS | Status: DC
  Administered 2020-07-16 – 2020-07-18 (×3): 1 via TRANSDERMAL
  Filled 2020-07-16 (×4): qty 1

## 2020-07-16 NOTE — ED Triage Notes (Signed)
Pt reports last week he pulled his back and now the pain radiates into his right hip and down his right leg. Pt reports he had an xray when he pulled his back and they told him he had a lot of arthritis.

## 2020-07-16 NOTE — ED Notes (Signed)
Pharmacist consulted about giving Solu-Medrol now due to patient getting 40mg  Prednisone this am. Administration of medication was approved. Wife remains at bedside. Patient was brought a lunch tray.

## 2020-07-16 NOTE — ED Notes (Addendum)
Patient was able to stand from stretcher with stand-by assistance x2. Patient was not able to maintain stance due to increased 8/10 lower back pain. Patient reports pain decreased to 4/10 once he was sitting still. Patient was able to move lower extremities equally and easily, but c/o pain when pressure was used when his wife put his shoes back on. Provider aware.

## 2020-07-16 NOTE — ED Notes (Signed)
Messaged RN bed assigned 

## 2020-07-16 NOTE — ED Provider Notes (Signed)
[facility] Emergency Department Provider Note  ____________________________________________   Event Date/Time   First MD Initiated Contact with Patient 07/16/20 [phone]     (approximate)  I have reviewed the triage vital signs and the nursing notes.   HISTORY  Chief Complaint Back Pain and Hip Pain    HPI Larry Russell is a 65 y.o. male  C/o low back pain for 7 day, known injury as he lifted heavy item, pain is worse with movement, increased with bending over, pain radiates to the right leg, patient is also complaining of right hip pain.  States that severe in the joint and radiates to the leg.  Denies numbness, tingling, or changes in bowel/urinary habits,  Using otc meds without relief, his physician put him on 40 mg of prednisone and he is on day 3.  Patient also uses Humira for his psoriasis.  His wife is concerned that maybe he has an infection due to the Humira. Remainder ros neg   Past Medical History:  Diagnosis Date  . Arthritis   . Hyperlipidemia   . Psoriasis     Patient Active Problem List   Diagnosis Date Noted  . Low back pain 07/16/2020    Past Surgical History:  Procedure Laterality Date  . APPENDECTOMY    . LEG SURGERY Right   . TONSILLECTOMY      Prior to Admission medications   Medication Sig Start Date End Date Taking? Authorizing Provider  Adalimumab (HUMIRA) 40 MG/0.4ML PSKT Inject 40 mg into the skin every 14 (fourteen) days. For maintenance. 03/24/20   Ralene Bathe, MD  calcipotriene (DOVONOX) 0.005 % cream Apply 1 application topically daily as needed.  01/16/13   [provider]  clobetasol (TEMOVATE) 0.05 % external solution Apply to aa's scalp QD - BID PRN 05/13/19   Ralene Bathe, MD  etodolac (LODINE) 400 MG tablet Take 400 mg by mouth as needed.    [provider]  Ixekizumab (TALTZ) 80 MG/ML SOAJ Inject 160 mg into the skin as directed. Inject 160 mg (2 x 80 mg) subcutaneous at week 0,  then begin first induction dose 80 mg (1 x 80 mg) 2 weeks later (week 2). 10/28/19   Ralene Bathe, MD  Ixekizumab (TALTZ) 80 MG/ML SOAJ Inject 80 mg into the skin every 14 (fourteen) days. Weeks 4-10. 10/28/19   Ralene Bathe, MD  Ixekizumab (TALTZ) 80 MG/ML SOAJ Inject 80 mg into the skin every 28 (twenty-eight) days. For maintenance. 10/28/19   Ralene Bathe, MD  mometasone (ELOCON) 0.1 % cream Apply 1 application topically daily as needed (Rash). 04/28/20   Ralene Bathe, MD  mometasone (ELOCON) 0.1 % lotion Apply topically daily. 10/28/19   Ralene Bathe, MD  mometasone (ELOCON) 0.1 % lotion Apply to scalp 5 nights a week 04/28/20   Ralene Bathe, MD    Allergies Celebrex [celecoxib] and Shellfish allergy  Family History  Problem Relation Age of Onset  . Cancer Mother   . Heart disease Father   . Psoriasis Father     Social History Social History   Tobacco Use  . Smoking status: Never Smoker  . Smokeless tobacco: Never Used  Substance Use Topics  . Alcohol use: No  . Drug use: No    Review of Systems  Constitutional: No fever/chills Eyes: No visual changes. ENT: No sore throat. Cardiovascular: No chest pain Respiratory: Denies cough Abdomen: No abdominal pain Genitourinary: Negative for dysuria. Musculoskeletal:  Positive for back pain.  Positive right hip pain Skin: Negative for rash. Psychiatric: No mood changes    ____________________________________________   PHYSICAL EXAM:  VITAL SIGNS: ED Triage Vitals  Enc Vitals Group     BP 07/16/20 0759 (!) 147/98     Pulse Rate 07/16/20 0759 81     Resp 07/16/20 0759 20     Temp 07/16/20 0759 97.9 F (36.6 C)     Temp Source 07/16/20 0759 Oral     SpO2 07/16/20 0759 96 %     Weight 07/16/20 0752 196 lb (88.9 kg)     Height 07/16/20 0752 6' (1.829 m)     Head Circumference --      Peak Flow --      Pain Score 07/16/20 0752 7     Pain Loc --      Pain Edu? --      Excl. in [place]? --      Constitutional: Alert and oriented. Well appearing and in no acute distress. Eyes: Conjunctivae are normal.  Head: Atraumatic. Nose: No congestion/rhinnorhea. Mouth/Throat: Mucous membranes are moist.   Neck:  supple no lymphadenopathy noted Cardiovascular: Normal rate, regular rhythm. Heart sounds are normal Respiratory: Normal respiratory effort.  No retractions, lungs c t a  Abd: soft nontender bs normal all 4 quad GU: deferred Musculoskeletal: FROM all extremities, warm and well perfused.  Decreased rom of back due to discomfort, lumbar spine mildly tender, positive slr, 5/5 strength in great toes b/l, 5/5 strength in lower legs, n/v intact, right hip is tender, pain is reproduced with external rotation Neurologic:  Normal speech and language.  Skin:  Skin is warm, dry and intact. No rash noted. Psychiatric: Mood and affect are normal. Speech and behavior are normal.  ____________________________________________   LABS (all labs ordered are listed, but only abnormal results are displayed)  Labs Reviewed  COMPREHENSIVE METABOLIC PANEL - Abnormal; Notable for the following components:      Result Value   Glucose, Bld 106 (*)    Total Protein 8.3 (*)    All other components within normal limits  CBC WITH DIFFERENTIAL/PLATELET  HIV ANTIBODY (ROUTINE TESTING W REFLEX)   ____________________________________________   ____________________________________________  RADIOLOGY  X-ray of the right hip MRI in lumbar spine  ____________________________________________   PROCEDURES  Procedure(s) performed: No  Procedures    ____________________________________________   INITIAL IMPRESSION / ASSESSMENT AND PLAN / ED COURSE  Pertinent labs & imaging results that were available during my care of the patient were reviewed by me and considered in my medical decision making (see chart for details).   Patient is a [age]-year-old male presents with low back pain and right  hip pain.  See HPI.  Physical exam shows patient appears stable  Due to concerns of infection secondary to Humira I did a CBC and metabolic panel.  CBC is normal X-ray of the right hip  Patient had x-rays of his lumbar spine and his regular doctors.  If the hip x-ray is negative we will do MRI of the lumbar spine   Patient has already been given morphine IV, will now give second medication for pain fentanyl 50 mg IV  X-ray and MRI reviewed by me. Radiologist states compression L4 nerve root with advanced spinal compression  Consult to Dr. Lacinda Axon.  States pain control, follow-up, question if injections would help first, states he is 4 weeks out on patient's for the same surgery.  However if the patient needs to  be admitted for pain control he will consult  Patient was given second dose of pain medication.  He is unable to stand without help.  Legs become weak with standing  Paged hospitalist for admission for pain control. Patient will be admitted for pain control, he is admitted in stable condition  As part of my medical decision making, I reviewed the following data within the [place] History obtained from family, Nursing notes reviewed and incorporated, Old chart reviewed, Radiograph reviewed , Discussed with admitting physician , A consult was requested and obtained from this/these consultant(s) Neurosurgery, Notes from prior ED visits and [place] Controlled Substance Database  ____________________________________________   FINAL CLINICAL IMPRESSION(S) / ED DIAGNOSES  Final diagnoses:  Lumbar radiculitis      NEW MEDICATIONS STARTED DURING THIS VISIT:  New Prescriptions   No medications on file     Note:  This document was prepared using Dragon voice recognition software and may include unintentional dictation errors.     Versie Starks, PA-C 07/16/20 1346    Lavonia Drafts, MD 07/16/20 [phone]

## 2020-07-16 NOTE — Consult Note (Signed)
Neurosurgery-New Consultation Evaluation 07/16/2020 Larry Russell [id number]  Identifying Statement: Larry Russell is a 65 y.o. male from [place] [address] with back and right leg pain  Physician Requesting Consultation: Dr Francine Graven   History of Present Illness: Larry Russell is here for evaluation of ongoing back pain going into the right hip.  He did have a history of pain such as this last year for which she did receive an injection.  This did improve and he noticed that a week ago he was lifting something heavy and started to have more pain in the back.  The pain then traveled around to the right side.  The pain is gotten so severe and rated 8 out of 10 that he has been unable to ambulate without pain.  He denies any new numbness but does have chronic foot numbness.  He denies any focal weakness.  He has not had any change in his bowel or bladder pattern. He does state over the past year he has had some nagging back pain but the leg pain he has not experienced before. He feels this worsens with standing. He does get some cramping in his calves.   He does have a history of psoriasis and is on Humira.    Past Medical History:  Past Medical History:  Diagnosis Date   Arthritis    Hyperlipidemia    Psoriasis     Social History: Social History   Socioeconomic History   Marital status: Married    Spouse name: Not on file   Number of children: Not on file   Years of education: Not on file   Highest education level: Not on file  Occupational History   Not on file  Tobacco Use   Smoking status: Never Smoker   Smokeless tobacco: Never Used  Substance and Sexual Activity   Alcohol use: No   Drug use: No   Sexual activity: Not on file  Other Topics Concern   Not on file  Social History Narrative   Not on file   Social Determinants of Health   Financial Resource Strain: Not on file  Food Insecurity: Not on file  Transportation Needs: Not on file  Physical Activity: Not  on file  Stress: Not on file  Social Connections: Not on file  Intimate Partner Violence: Not on file    Family History: Family History  Problem Relation Age of Onset   Cancer Mother    Heart disease Father    Psoriasis Father     Review of Systems:  Review of Systems - General ROS: Negative Psychological ROS: Negative Ophthalmic ROS: Negative ENT ROS: Negative Hematological and Lymphatic ROS: Negative  Endocrine ROS: Negative Respiratory ROS: Negative Cardiovascular ROS: Negative Gastrointestinal ROS: Negative Genito-Urinary ROS: Negative Musculoskeletal ROS: Positive for back pain Neurological ROS: Positive for leg pain Dermatological ROS: Negative  Physical Exam: BP (!) 131/91 (BP Location: Left Arm)   Pulse 81   Temp 98.2 F (36.8 C) (Oral)   Resp 16   Ht 6' (1.829 m)   Wt 88.9 kg   SpO2 95%   BMI 26.58 kg/m  Body mass index is 26.58 kg/m. Body surface area is 2.13 meters squared. General appearance: Alert, cooperative, in no acute distress Head: Normocephalic, atraumatic Eyes: Normal, EOM intact Oropharynx: Moist without lesions Back: Tenderness to palpation Ext: No edema in LE bilaterally  Neurologic exam:  Mental status: alertness: alert, affect: normal Speech: fluent and clear Motor:strength symmetric 5/5 in bilateral lower extremities throughout  all motor groups Sensory: intact to light touch in bilateral lower extremities Gait: Not tested  Laboratory: Results for orders placed or performed during the hospital encounter of 07/16/20  Resp Panel by RT-PCR (Flu A&B, Covid) Nasopharyngeal Swab   Specimen: Nasopharyngeal Swab; Nasopharyngeal(NP) swabs in vial transport medium  Result Value Ref Range   SARS Coronavirus 2 by RT PCR NEGATIVE NEGATIVE   Influenza A by PCR NEGATIVE NEGATIVE   Influenza B by PCR NEGATIVE NEGATIVE  Comprehensive metabolic panel  Result Value Ref Range   Sodium 138 135 - 145 mmol/L   Potassium 3.7 3.5 - 5.1 mmol/L    Chloride 104 98 - 111 mmol/L   CO2 25 22 - 32 mmol/L   Glucose, Bld 106 (H) 70 - 99 mg/dL   BUN 18 8 - 23 mg/dL   Creatinine, Ser 0.92 0.61 - 1.24 mg/dL   Calcium 9.3 8.9 - 10.3 mg/dL   Total Protein 8.3 (H) 6.5 - 8.1 g/dL   Albumin 4.3 3.5 - 5.0 g/dL   AST 26 15 - 41 U/L   ALT 36 0 - 44 U/L   Alkaline Phosphatase 57 38 - 126 U/L   Total Bilirubin 0.9 0.3 - 1.2 mg/dL   GFR, Estimated >60 >60 mL/min   Anion gap 9 5 - 15  CBC with Differential  Result Value Ref Range   WBC 9.3 4.0 - 10.5 K/uL   RBC 4.52 4.22 - 5.81 MIL/uL   Hemoglobin 14.7 13.0 - 17.0 g/dL   HCT 41.9 39.0 - 52.0 %   MCV 92.7 80.0 - 100.0 fL   MCH 32.5 26.0 - 34.0 pg   MCHC 35.1 30.0 - 36.0 g/dL   RDW 12.9 11.5 - 15.5 %   Platelets 209 150 - 400 K/uL   nRBC 0.0 0.0 - 0.2 %   Neutrophils Relative % 60 %   Neutro Abs 5.5 1.7 - 7.7 K/uL   Lymphocytes Relative 33 %   Lymphs Abs 3.0 0.7 - 4.0 K/uL   Monocytes Relative 7 %   Monocytes Absolute 0.6 0.1 - 1.0 K/uL   Eosinophils Relative 0 %   Eosinophils Absolute 0.0 0.0 - 0.5 K/uL   Basophils Relative 0 %   Basophils Absolute 0.0 0.0 - 0.1 K/uL   Immature Granulocytes 0 %   Abs Immature Granulocytes 0.02 0.00 - 0.07 K/uL   I personally reviewed labs  Imaging: MRI lumbar spine: There is some mild straightening of the curvature.  There is multilevel degenerative change that appears worse at L4-5 level.  There is facet widening at this level.  There is a grade 1 anterior listhesis which does produce bilateral, lateral recess stenosis, right greater than left.  There is a small central disc protrusion at L5-S1.  There is no other significant areas of central or foraminal stenosis noted.   Impression/Plan:  Larry Russell is here for evaluation of back and leg pain.  Given the acuity of this, no surgical intervention is recommended but rather a more conservative, medical approach should be performed.  I do recommend continuing his oral steroid taper.  He would likely  benefit from physical therapy, medications, and repeat lumbar injection.  If this should fail to control his pain, I am glad to see him as an outpatient to discuss other possible interventions.  Typically, acute pain such that this can take 6 to 8 weeks to improve. If he develops any weakness or worse symptoms, please re-consult   1.  Diagnosis: Back  and leg pain  2.  Plan -Recommend medical therapy including possible ESI. No surgery indicated at this time, can follow-up as an outpatient in the [place] clinic in 4 to 5 weeks

## 2020-07-16 NOTE — H&P (Signed)
History and Physical    Larry Russell [id number]:[id number] DOB: [birth year] DOA: 07/16/2020  PCP: Rusty Aus, MD   Patient coming from: Home  I have personally briefly reviewed patient's old medical records in [place]  Chief Complaint: Low back pain  HPI: Larry Russell is a 65 y.o. male with medical history significant for arthritis and psoriasis who presents to the ER for evaluation of low back pain which she has had for about a week. Patient has a history of arthritis involving his lower back but states that he developed pain in his back about a week ago after he lifted a lawnmower.  Pain was in his lumbar area, around the belt line and was described as a sharp pain in the midline.  Over the last couple of days the pain is mostly towards the right side of his lower back with radiation to his right hip and is rated 8 x 10 in intensity at its worst.  Pain is worse with any form of movement and patient has been unable to walk due to the severity of the pain.  His wife has been rolling him around the house in an office chair. He denies having any urinary or fecal incontinence, no fever, no chills, no saddle anesthesia, no recent trauma or falls. He denies having any chest pain, no shortness of breath, no nausea, no vomiting, no diaphoresis, no palpitations, no headache, no blurred vision, no dizziness, no lightheadedness, no urinary symptoms or any changes in bowel habits. Labs show sodium 138, potassium 3.7, chloride 104, bicarb 25, glucose 106, BUN 18, creatinine 0.92, calcium 9.3, alkaline phosphatase 57, albumin 4.3, AST 26, ALT 36, total protein 8.3, white count 9.3, hemoglobin 14.7, hematocrit 41.9, MCV 92.7, RDW 12.9, platelet count 209 Chest x-ray reviewed by me shows no acute intrathoracic process MRI of the lumbar spine shows dominant findings at L4-5 where there is a right foraminal extrusion impinging on the right L4 nerve root. Chronic degeneration at this level is focally  advanced and progressed from 2020 with high-grade spinal stenosis. Noncompressive degenerative changes at the other levels is stable and described above.    ED Course: Patient is a 65 year old who presents to the emergency room for evaluation of low back pain mostly at the belt line with radiation to his right hip and difficulty with ambulation. Imaging shows a right foraminal extrusion impinging on the right L4 nerve root as well as high-grade spinal stenosis. Patient will be referred to observation status for pain control.  Review of Systems: As per HPI otherwise all other systems reviewed and negative.    Past Medical History:  Diagnosis Date  . Arthritis   . Hyperlipidemia   . Psoriasis     Past Surgical History:  Procedure Laterality Date  . APPENDECTOMY    . LEG SURGERY Right   . TONSILLECTOMY       reports that he has never smoked. He has never used smokeless tobacco. He reports that he does not drink alcohol and does not use drugs.  Allergies  Allergen Reactions  . Celebrex [Celecoxib] Itching  . Shellfish Allergy Hives and Itching    Family History  Problem Relation Age of Onset  . Cancer Mother   . Heart disease Father   . Psoriasis Father       Prior to Admission medications   Medication Sig Start Date End Date Taking? Authorizing Provider  Adalimumab (HUMIRA) 40 MG/0.4ML PSKT Inject 40 mg into the skin every  14 (fourteen) days. For maintenance. 03/24/20   Ralene Bathe, MD  calcipotriene (DOVONOX) 0.005 % cream Apply 1 application topically daily as needed.  01/16/13   [provider]  clobetasol (TEMOVATE) 0.05 % external solution Apply to aa's scalp QD - BID PRN 05/13/19   Ralene Bathe, MD  etodolac (LODINE) 400 MG tablet Take 400 mg by mouth as needed.    [provider]  Ixekizumab (TALTZ) 80 MG/ML SOAJ Inject 160 mg into the skin as directed. Inject 160 mg (2 x 80 mg) subcutaneous at week 0, then begin first induction dose 80 mg  (1 x 80 mg) 2 weeks later (week 2). 10/28/19   Ralene Bathe, MD  Ixekizumab (TALTZ) 80 MG/ML SOAJ Inject 80 mg into the skin every 14 (fourteen) days. Weeks 4-10. 10/28/19   Ralene Bathe, MD  Ixekizumab (TALTZ) 80 MG/ML SOAJ Inject 80 mg into the skin every 28 (twenty-eight) days. For maintenance. 10/28/19   Ralene Bathe, MD  mometasone (ELOCON) 0.1 % cream Apply 1 application topically daily as needed (Rash). 04/28/20   Ralene Bathe, MD  mometasone (ELOCON) 0.1 % lotion Apply topically daily. 10/28/19   Ralene Bathe, MD  mometasone (ELOCON) 0.1 % lotion Apply to scalp 5 nights a week 04/28/20   Ralene Bathe, MD    Physical Exam: Vitals:   07/16/20 0759 07/16/20 1120 07/16/20 1137 07/16/20 1200  BP: (!) 147/98 (!) 145/70 135/88 (!) 130/92  Pulse: 81 85 73 72  Resp: 20 18 16    Temp: 97.9 F (36.6 C) 98.2 F (36.8 C)    TempSrc: Oral Oral    SpO2: 96% 98% 95%   Weight: 88.9 kg     Height: 6' (1.829 m)        Vitals:   07/16/20 0759 07/16/20 1120 07/16/20 1137 07/16/20 1200  BP: (!) 147/98 (!) 145/70 135/88 (!) 130/92  Pulse: 81 85 73 72  Resp: 20 18 16    Temp: 97.9 F (36.6 C) 98.2 F (36.8 C)    TempSrc: Oral Oral    SpO2: 96% 98% 95%   Weight: 88.9 kg     Height: 6' (1.829 m)         Constitutional: Alert and oriented x 3 . Not in any apparent distress HEENT:      Head: Normocephalic and atraumatic.         Eyes: PERLA, EOMI, Conjunctivae are normal. Sclera is non-icteric.       Mouth/Throat: Mucous membranes are moist.       Neck: Supple with no signs of meningismus. Cardiovascular: Regular rate and rhythm. No murmurs, gallops, or rubs. 2+ symmetrical distal pulses are present . No JVD. No LE edema Respiratory: Respiratory effort normal .Lungs sounds clear bilaterally. No wheezes, crackles, or rhonchi.  Gastrointestinal: Soft, non tender, and non distended with positive bowel sounds.  Genitourinary: No CVA tenderness. Musculoskeletal:  Decreased  range of motion lumbar spine.  Paraspinal muscle tenderness mostly on the right  Neurologic:  Face is symmetric. Moving all extremities. No gross focal neurologic deficits  Skin: Skin is warm, dry.  No rash or ulcers Psychiatric: Mood and affect are normal   Labs on Admission: I have personally reviewed following labs and imaging studies  CBC: Recent Labs  Lab 07/16/20 0829  WBC 9.3  NEUTROABS 5.5  HGB 14.7  HCT 41.9  MCV 92.7  PLT 712   Basic Metabolic Panel: Recent Labs  Lab 07/16/20 0829  NA 138  K 3.7  CL 104  CO2 25  GLUCOSE 106*  BUN 18  CREATININE 0.92  CALCIUM 9.3   GFR: Estimated Creatinine Clearance: 86.7 mL/min (by C-G formula based on SCr of 0.92 mg/dL). Liver Function Tests: Recent Labs  Lab 07/16/20 0829  AST 26  ALT 36  ALKPHOS 57  BILITOT 0.9  PROT 8.3*  ALBUMIN 4.3   No results for input(s): LIPASE, AMYLASE in the last 168 hours. No results for input(s): AMMONIA in the last 168 hours. Coagulation Profile: No results for input(s): INR, PROTIME in the last 168 hours. Cardiac Enzymes: No results for input(s): CKTOTAL, CKMB, CKMBINDEX, TROPONINI in the last 168 hours. BNP (last 3 results) No results for input(s): PROBNP in the last 8760 hours. HbA1C: No results for input(s): HGBA1C in the last 72 hours. CBG: No results for input(s): GLUCAP in the last 168 hours. Lipid Profile: No results for input(s): CHOL, HDL, LDLCALC, TRIG, CHOLHDL, LDLDIRECT in the last 72 hours. Thyroid Function Tests: No results for input(s): TSH, T4TOTAL, FREET4, T3FREE, THYROIDAB in the last 72 hours. Anemia Panel: No results for input(s): VITAMINB12, FOLATE, FERRITIN, TIBC, IRON, RETICCTPCT in the last 72 hours. Urine analysis: No results found for: COLORURINE, APPEARANCEUR, LABSPEC, PHURINE, GLUCOSEU, HGBUR, BILIRUBINUR, KETONESUR, PROTEINUR, UROBILINOGEN, NITRITE, LEUKOCYTESUR  Radiological Exams on Admission: MR LUMBAR SPINE WO CONTRAST  Result Date:  07/16/2020 CLINICAL DATA:  Low back pain with progressive neurologic deficit. Infection suspected EXAM: MRI LUMBAR SPINE WITHOUT CONTRAST TECHNIQUE: Multiplanar, multisequence MR imaging of the lumbar spine was performed. No intravenous contrast was administered. COMPARISON:  09/10/2018 FINDINGS: Segmentation:  5 lumbar type vertebrae Alignment:  Grade 1 anterolisthesis at L4-5 Vertebrae:  No fracture, evidence of discitis, or bone lesion. Conus medullaris and cauda equina: Conus extends to the L1 level. Conus and cauda equina appear normal. Paraspinal and other soft tissues: Proteinaceous retention cyst appearance in the right kidney with layering hematocrit. Disc levels: T12- L1: Unremarkable. L1-L2: Mild disc narrowing and bulging with ventral spurring L2-L3: Mild facet spurring L3-L4: Mild facet spurring and annulus bulging L4-L5: Advanced facet osteoarthritis with spurring and joint distortion. There is interspinous ganglion and anterolisthesis. The disc is narrowed and bulging with annular fissuring and shallow central protrusion. Progressed and advanced spinal stenosis. There is a new right foraminal extrusion impinging on the exiting L4 nerve root. L5-S1:Small left paracentral protrusion contacting but not compressing the left S1 nerve root. Mild facet spurring. IMPRESSION: 1. Dominant findings at L4-5 where there is a right foraminal extrusion impinging on the right L4 nerve root. Chronic degeneration at this level is focally advanced and progressed from 2020 with high-grade spinal stenosis. 2. Noncompressive degenerative changes at the other levels is stable and described above. Electronically Signed   By: Monte Fantasia M.D.   On: 07/16/2020 10:51   DG Hip Unilat W or Wo Pelvis 2-3 Views Right  Result Date: 07/16/2020 CLINICAL DATA:  Pain radiating to the right hip and leg EXAM: DG HIP (WITH OR WITHOUT PELVIS) 2-3V RIGHT COMPARISON:  None. FINDINGS: There is no evidence of hip fracture or  dislocation. There is no evidence of arthropathy or other focal bone abnormality. IMPRESSION: Negative. Electronically Signed   By: Monte Fantasia M.D.   On: 07/16/2020 09:30     Assessment/Plan Principal Problem:   Low back pain    Patient presents to the ER for evaluation of acute low back pain isolated to have high-grade spinal stenosis as well as right foraminal extrusion impinging  the right L4 nerve. He is unable to ambulate due to severity of his low back pain. We will place patient on Solu-Medrol 60 mg IV daily Place a Lidoderm patch We will consult neurosurgery Patient will benefit from PT evaluation once his pain is better controlled  DVT prophylaxis: SCD  Code Status: full code Family Communication: Greater than 50% of time spent discussing patient's condition and plan of care with him and his wife at the bedside.  All questions and concerns have been addressed.  They verbalized understanding and agree with the plan. Disposition Plan: Back to previous home environment Consults called: Neurosurgery Status: Observation    Lera Gaines MD [facility]     07/16/2020, 1:52 PM

## 2020-07-16 NOTE — Plan of Care (Signed)
New admission

## 2020-07-16 NOTE — ED Notes (Signed)
Pharmacist consulted before Toradol was given. Patient and wife also consulted and agreed.

## 2020-07-17 DIAGNOSIS — M4726 Other spondylosis with radiculopathy, lumbar region: Secondary | ICD-10-CM | POA: Diagnosis present

## 2020-07-17 DIAGNOSIS — Z79899 Other long term (current) drug therapy: Secondary | ICD-10-CM | POA: Diagnosis not present

## 2020-07-17 DIAGNOSIS — D72829 Elevated white blood cell count, unspecified: Secondary | ICD-10-CM | POA: Diagnosis not present

## 2020-07-17 DIAGNOSIS — L409 Psoriasis, unspecified: Secondary | ICD-10-CM | POA: Diagnosis present

## 2020-07-17 DIAGNOSIS — M5416 Radiculopathy, lumbar region: Secondary | ICD-10-CM

## 2020-07-17 DIAGNOSIS — M5441 Lumbago with sciatica, right side: Secondary | ICD-10-CM | POA: Diagnosis not present

## 2020-07-17 DIAGNOSIS — M545 Low back pain, unspecified: Secondary | ICD-10-CM | POA: Diagnosis not present

## 2020-07-17 DIAGNOSIS — M48061 Spinal stenosis, lumbar region without neurogenic claudication: Secondary | ICD-10-CM | POA: Diagnosis present

## 2020-07-17 DIAGNOSIS — Z8249 Family history of ischemic heart disease and other diseases of the circulatory system: Secondary | ICD-10-CM | POA: Diagnosis not present

## 2020-07-17 DIAGNOSIS — E785 Hyperlipidemia, unspecified: Secondary | ICD-10-CM | POA: Diagnosis present

## 2020-07-17 DIAGNOSIS — Z20822 Contact with and (suspected) exposure to covid-19: Secondary | ICD-10-CM | POA: Diagnosis present

## 2020-07-17 DIAGNOSIS — T380X5A Adverse effect of glucocorticoids and synthetic analogues, initial encounter: Secondary | ICD-10-CM | POA: Diagnosis not present

## 2020-07-17 DIAGNOSIS — M25551 Pain in right hip: Secondary | ICD-10-CM | POA: Diagnosis present

## 2020-07-17 LAB — BASIC METABOLIC PANEL
Anion gap: 10 (ref 5–15)
BUN: 22 mg/dL (ref 8–23)
CO2: 25 mmol/L (ref 22–32)
Calcium: 9.2 mg/dL (ref 8.9–10.3)
Chloride: 102 mmol/L (ref 98–111)
Creatinine, Ser: 1.01 mg/dL (ref 0.61–1.24)
GFR, Estimated: >60 mL/min (ref >60)
Glucose, Bld: 123 mg/dL — ABNORMAL HIGH (ref 70–99)
Potassium: 4.2 mmol/L (ref 3.5–5.1)
Sodium: 137 mmol/L (ref 135–145)

## 2020-07-17 LAB — CBC
HCT: 41 % (ref 39.0–52.0)
Hemoglobin: 14.5 g/dL (ref 13.0–17.0)
MCH: 32.5 pg (ref 26.0–34.0)
MCHC: 35.4 g/dL (ref 30.0–36.0)
MCV: 91.9 fL (ref 80.0–100.0)
Platelets: 232 10*3/uL (ref 150–400)
RBC: 4.46 MIL/uL (ref 4.22–5.81)
RDW: 12.6 % (ref 11.5–15.5)
WBC: 14.3 10*3/uL — ABNORMAL HIGH (ref 4.0–10.5)
nRBC: 0 % (ref 0.0–0.2)

## 2020-07-17 LAB — HIV ANTIBODY (ROUTINE TESTING W REFLEX): HIV Screen 4th Generation wRfx: NONREACTIVE

## 2020-07-17 MED ORDER — POLYETHYLENE GLYCOL 3350 17 G PO PACK: 17.0000 g | PACK | Freq: Every day | ORAL | Status: DC | PRN

## 2020-07-17 MED ORDER — METHOCARBAMOL 500 MG PO TABS
500.0000 mg | ORAL_TABLET | Freq: Four times a day (QID) | ORAL | Status: AC | PRN
  Administered 2020-07-17 – 2020-07-18 (×2): 500 mg via ORAL
  Filled 2020-07-17 (×2): qty 1

## 2020-07-17 MED ORDER — ENOXAPARIN SODIUM 40 MG/0.4ML IJ SOSY
40.0000 mg | PREFILLED_SYRINGE | INTRAMUSCULAR | Status: DC
  Administered 2020-07-17 – 2020-07-19 (×3): 40 mg via SUBCUTANEOUS
  Filled 2020-07-17 (×3): qty 0.4

## 2020-07-17 MED ORDER — SENNOSIDES-DOCUSATE SODIUM 8.6-50 MG PO TABS
2.0000 | ORAL_TABLET | Freq: Every day | ORAL | Status: DC
  Administered 2020-07-17 – 2020-07-18 (×2): 2 via ORAL
  Filled 2020-07-17 (×2): qty 2

## 2020-07-17 MED ORDER — HYDROCODONE-ACETAMINOPHEN 5-325 MG PO TABS
1.0000 | ORAL_TABLET | ORAL | Status: DC | PRN
  Administered 2020-07-17 – 2020-07-19 (×8): 1 via ORAL
  Filled 2020-07-17 (×8): qty 1

## 2020-07-17 NOTE — Progress Notes (Signed)
[facility] PROGRESS NOTE   Larry Russell UXL:[id number] DOB: 1955/04/09 DOA: 07/16/2020  PCP: Rusty Aus, MD  Brief History/Interval Summary: 65 y.o. male with medical history significant for arthritis and psoriasis who presented to the ER for evaluation of low back pain which he has had for about a week. Patient has a history of arthritis involving his lower back but states that he developed pain in his back about a week prior to admission after he lifted a lawnmower.  He had difficulty ambulating.  Had 10 out of 10 pain.  Did not have any red flag symptoms such as urinary or fecal incontinence, fever chills, no saddle anesthesia.  He presented to the ED.  Underwent MRI of the lumbar spine which showed foraminal extrusion in the L4-5 area impinging on the right L4 nerve root.  Chronic degeneration was noted which was focally advanced with high-grade spinal stenosis.  Reason for Visit: Low back pain  Consultants: Neurosurgery  Procedures: None yet  Antibiotics: Anti-infectives (From admission, onward)   None      Subjective/Interval History: Patient mentions that pain is 7-8 out of 10 in intensity.  Has been finding it very difficult to ambulate.  Denies any other symptoms such as nausea vomiting fever or chills.  Has seen outpatient provider and has received multiple injections to the back in the past.     Assessment/Plan:  Low back pain with concern for nerve impingement of L4 with high-grade spinal stenosis with lumbar radiculitis Patient seen by neurosurgery yesterday afternoon.  Patient does not have any neurological deficits currently.  Medical management recommended by neurosurgery.  They agree with continuing steroids and getting patient to be seen by PT and OT. Based on previous records it appears that patient has a longstanding history of lumbar issues.  Is followed by Dr. Nanci Pina who is a PMR specialist and has received injections to his back. Patient  was told that once he has better from this acute episode he will need to go and see this provider again. PT and OT evaluation.  Continue with muscle relaxants.  Lidoderm patch.  Leave him on Solu-Medrol for now.  Will change to oral agents tomorrow.  Leukocytosis is due to steroids.  History of psoriasis Followed by dermatology in the outpatient setting.  DVT Prophylaxis: Initiate Lovenox Code Status: Full code Family Communication: Discussed with the patient Disposition Plan: Hopefully return home when improved  Status is: Observation  The patient will require care spanning > 2 midnights and should be moved to inpatient because: Ongoing active pain requiring inpatient pain management, IV treatments appropriate due to intensity of illness or inability to take PO and Inpatient level of care appropriate due to severity of illness  Dispo: The patient is from: Home              Anticipated d/c is to: Home              Patient currently is not medically stable to d/c.   Difficult to place patient No       Medications:  Scheduled: . cyclobenzaprine  10 mg Oral TID  . lidocaine  1 patch Transdermal Q24H  . methylPREDNISolone (SOLU-MEDROL) injection  60 mg Intravenous Daily  . pantoprazole (PROTONIX) IV  40 mg Intravenous Q24H  . sodium chloride flush  3 mL Intravenous Q12H   Continuous: . sodium chloride     ZDG:UYQIHK chloride, acetaminophen **OR** acetaminophen, morphine injection, ondansetron **OR** ondansetron (ZOFRAN) IV, sodium chloride  flush   Objective:  Vital Signs  Vitals:   07/16/20 1625 07/16/20 1937 07/17/20 0454 07/17/20 0915  BP: (!) 168/115 (!) 143/89 (!) 150/98 (!) 164/62  Pulse: 91 89 78 87  Resp: 20 17 17 18   Temp: (!) 97.4 F (36.3 C) 98.2 F (36.8 C) 98 F (36.7 C) 97.9 F (36.6 C)  TempSrc: Oral Oral Oral   SpO2: 98% 90% 92% 96%  Weight:      Height:        Intake/Output Summary (Last 24 hours) at 07/17/2020 0935 Last data filed at 07/17/2020  0530 Gross per 24 hour  Intake --  Output 1200 ml  Net -1200 ml   Filed Weights   07/16/20 0752 07/16/20 0759  Weight: 88.9 kg 88.9 kg    General appearance: Awake alert.  In no distress Resp: Clear to auscultation bilaterally.  Normal effort Cardio: S1-S2 is normal regular.  No S3-S4.  No rubs murmurs or bruit GI: Abdomen is soft.  Nontender nondistended.  Bowel sounds are present normal.  No masses organomegaly Extremities: No edema.  Able to lift both of his lower extremities off the bed.  Slight limitation range of motion of the right leg.  No significant motor deficits noted. Neurologic: Alert and oriented x3.  No focal neurological deficits.    Lab Results:  Data Reviewed: I have personally reviewed following labs and imaging studies  CBC: Recent Labs  Lab 07/16/20 0829 07/17/20 0512  WBC 9.3 14.3*  NEUTROABS 5.5  --   HGB 14.7 14.5  HCT 41.9 41.0  MCV 92.7 91.9  PLT 209 536    Basic Metabolic Panel: Recent Labs  Lab 07/16/20 0829 07/17/20 0512  NA 138 137  K 3.7 4.2  CL 104 102  CO2 25 25  GLUCOSE 106* 123*  BUN 18 22  CREATININE 0.92 1.01  CALCIUM 9.3 9.2    GFR: Estimated Creatinine Clearance: 79 mL/min (by C-G formula based on SCr of 1.01 mg/dL).  Liver Function Tests: Recent Labs  Lab 07/16/20 0829  AST 26  ALT 36  ALKPHOS 57  BILITOT 0.9  PROT 8.3*  ALBUMIN 4.3     Recent Results (from the past 240 hour(s))  Resp Panel by RT-PCR (Flu A&B, Covid) Nasopharyngeal Swab     Status: None   Collection Time: 07/16/20  2:25 PM   Specimen: Nasopharyngeal Swab; Nasopharyngeal(NP) swabs in vial transport medium  Result Value Ref Range Status   SARS Coronavirus 2 by RT PCR NEGATIVE NEGATIVE Final    Comment: (NOTE) SARS-CoV-2 target nucleic acids are NOT DETECTED.  The SARS-CoV-2 RNA is generally detectable in upper respiratory specimens during the acute phase of infection. The lowest concentration of SARS-CoV-2 viral copies this assay can  detect is 138 copies/mL. A negative result does not preclude SARS-Cov-2 infection and should not be used as the sole basis for treatment or other patient management decisions. A negative result may occur with  improper specimen collection/handling, submission of specimen other than nasopharyngeal swab, presence of viral mutation(s) within the areas targeted by this assay, and inadequate number of viral copies(<138 copies/mL). A negative result must be combined with clinical observations, patient history, and epidemiological information. The expected result is Negative.  Fact Sheet for Patients:  EntrepreneurPulse.com.au  Fact Sheet for Healthcare Providers:  IncredibleEmployment.be  This test is no t yet approved or cleared by the [place] FDA and  has been authorized for detection and/or diagnosis of SARS-CoV-2 by FDA under an Emergency  Use Authorization (EUA). This EUA will remain  in effect (meaning this test can be used) for the duration of the COVID-19 declaration under Section 564(b)(1) of the Act, 21 U.S.C.section 360bbb-3(b)(1), unless the authorization is terminated  or revoked sooner.       Influenza A by PCR NEGATIVE NEGATIVE Final   Influenza B by PCR NEGATIVE NEGATIVE Final    Comment: (NOTE) The Xpert Xpress SARS-CoV-2/FLU/RSV plus assay is intended as an aid in the diagnosis of influenza from Nasopharyngeal swab specimens and should not be used as a sole basis for treatment. Nasal washings and aspirates are unacceptable for Xpert Xpress SARS-CoV-2/FLU/RSV testing.  Fact Sheet for Patients: EntrepreneurPulse.com.au  Fact Sheet for Healthcare Providers: IncredibleEmployment.be  This test is not yet approved or cleared by the [place] FDA and has been authorized for detection and/or diagnosis of SARS-CoV-2 by FDA under an Emergency Use Authorization (EUA). This EUA will remain in  effect (meaning this test can be used) for the duration of the COVID-19 declaration under Section 564(b)(1) of the Act, 21 U.S.C. section 360bbb-3(b)(1), unless the authorization is terminated or revoked.  Performed at [facility], [address] [place], [place] [address]       Radiology Studies: MR LUMBAR SPINE WO CONTRAST  Result Date: 07/16/2020 CLINICAL DATA:  Low back pain with progressive neurologic deficit. Infection suspected EXAM: MRI LUMBAR SPINE WITHOUT CONTRAST TECHNIQUE: Multiplanar, multisequence MR imaging of the lumbar spine was performed. No intravenous contrast was administered. COMPARISON:  09/10/2018 FINDINGS: Segmentation:  5 lumbar type vertebrae Alignment:  Grade 1 anterolisthesis at L4-5 Vertebrae:  No fracture, evidence of discitis, or bone lesion. Conus medullaris and cauda equina: Conus extends to the L1 level. Conus and cauda equina appear normal. Paraspinal and other soft tissues: Proteinaceous retention cyst appearance in the right kidney with layering hematocrit. Disc levels: T12- L1: Unremarkable. L1-L2: Mild disc narrowing and bulging with ventral spurring L2-L3: Mild facet spurring L3-L4: Mild facet spurring and annulus bulging L4-L5: Advanced facet osteoarthritis with spurring and joint distortion. There is interspinous ganglion and anterolisthesis. The disc is narrowed and bulging with annular fissuring and shallow central protrusion. Progressed and advanced spinal stenosis. There is a new right foraminal extrusion impinging on the exiting L4 nerve root. L5-S1:Small left paracentral protrusion contacting but not compressing the left S1 nerve root. Mild facet spurring. IMPRESSION: 1. Dominant findings at L4-5 where there is a right foraminal extrusion impinging on the right L4 nerve root. Chronic degeneration at this level is focally advanced and progressed from 2020 with high-grade spinal stenosis. 2. Noncompressive degenerative changes at the other  levels is stable and described above. Electronically Signed   By: Monte Fantasia M.D.   On: 07/16/2020 10:51   DG Hip Unilat W or Wo Pelvis 2-3 Views Right  Result Date: 07/16/2020 CLINICAL DATA:  Pain radiating to the right hip and leg EXAM: DG HIP (WITH OR WITHOUT PELVIS) 2-3V RIGHT COMPARISON:  None. FINDINGS: There is no evidence of hip fracture or dislocation. There is no evidence of arthropathy or other focal bone abnormality. IMPRESSION: Negative. Electronically Signed   By: Monte Fantasia M.D.   On: 07/16/2020 09:30       LOS: 0 days   [facility] Pager on [email or web address]  07/17/2020, 9:35 AM

## 2020-07-17 NOTE — Plan of Care (Signed)

## 2020-07-18 DIAGNOSIS — M5416 Radiculopathy, lumbar region: Secondary | ICD-10-CM | POA: Diagnosis not present

## 2020-07-18 MED ORDER — PANTOPRAZOLE SODIUM 40 MG PO TBEC
40.0000 mg | DELAYED_RELEASE_TABLET | Freq: Every day | ORAL | Status: DC
  Administered 2020-07-18: 40 mg via ORAL
  Filled 2020-07-18: qty 1

## 2020-07-18 MED ORDER — GABAPENTIN 600 MG PO TABS
300.0000 mg | ORAL_TABLET | Freq: Two times a day (BID) | ORAL | Status: DC
  Administered 2020-07-18 – 2020-07-19 (×3): 300 mg via ORAL
  Filled 2020-07-18 (×3): qty 1

## 2020-07-18 MED ORDER — PANTOPRAZOLE SODIUM 40 MG PO TBEC: 40.0000 mg | DELAYED_RELEASE_TABLET | Freq: Every day | ORAL | Status: DC

## 2020-07-18 MED ORDER — METHOCARBAMOL 500 MG PO TABS
500.0000 mg | ORAL_TABLET | Freq: Three times a day (TID) | ORAL | Status: AC
  Administered 2020-07-18 (×2): 500 mg via ORAL
  Filled 2020-07-18 (×2): qty 1

## 2020-07-18 MED ORDER — DICLOFENAC SODIUM 75 MG PO TBEC
75.0000 mg | DELAYED_RELEASE_TABLET | Freq: Two times a day (BID) | ORAL | Status: DC
  Administered 2020-07-18 – 2020-07-19 (×3): 75 mg via ORAL
  Filled 2020-07-18 (×4): qty 1

## 2020-07-18 NOTE — Evaluation (Signed)
Physical Therapy Evaluation Patient Details Name: Larry Russell MRN: [id number] DOB: 1956-02-08 Today's Date: 07/18/2020   History of Present Illness  65 y.o. male with medical history significant for arthritis and psoriasis who presented to the ER for evaluation of low back pain which he has had for about a week. MRI shows L4 right foraminal extrusion on right L4 nerve root. MRI also shows worsening spinal stenosis.  Clinical Impression  65 yo male reports worsening back pain over last week after trying to lift lawnmower when changing the blades. He has a history of chronic low back pain but states the pain he is experiencing now is some of the worst he has ever experienced. Pt reports sharp shooting pain that begins on right side of low back and shoots down hip of RLE. He denies any numbness/tingling down RLE, no change in bowel/bladder. He denies any recent falls. Patient lives at home with his wife. He is recently retired. He was ambulating independently and required no assistance with ADLs. Currently he has a hard time standing upright with increased pain with lumbar extension. Pt is mod I for bed mobility. He required CGA for sit<>Stand transfers using RW. He ambulated in room with RW with forward flexed gait and antalgic gait pattern requiring CGA for safety.  Educated patient in various positioning to reduce back discomfort including also using ice/heat prn. Patient also has a home TENs unit, reinforced its use for pain management. Recommend outpatient PT to address back pain upon discharge. Patient agreeable.     Follow Up Recommendations Outpatient PT    Equipment Recommendations  None recommended by PT    Recommendations for Other Services       Precautions / Restrictions Precautions Precautions: Fall Precaution Comments: back precautions for comfort Restrictions Weight Bearing Restrictions: No      Mobility  Bed Mobility Overal bed mobility: Modified Independent              General bed mobility comments: no physical assistance provided. Does require increased time/effort due to pain    Transfers Overall transfer level: Needs assistance Equipment used: Rolling walker (2 wheeled) Transfers: Sit to/from Stand Sit to Stand: Min guard Stand pivot transfers: Supervision       General transfer comment: cues for positioning for comfort;  Ambulation/Gait Ambulation/Gait assistance: Min guard Gait Distance (Feet): 30 Feet Assistive device: Rolling walker (2 wheeled)   Gait velocity: decreased   General Gait Details: ambulates with flexed posture, antalgic gait pattern; He is able to achieve neutral stance positioning but reports increased pain; short stance time on right, decreased step length left with heavy weight bearing in BUE  Stairs            Wheelchair Mobility    Modified Rankin (Stroke Patients Only)       Balance Overall balance assessment: Needs assistance Sitting-balance support: Feet supported Sitting balance-Leahy Scale: Normal     Standing balance support: During functional activity Standing balance-Leahy Scale: Fair Standing balance comment: reliance of RW for UE support                             Pertinent Vitals/Pain Pain Assessment: 0-10 Pain Score: 8  Pain Location: back, with mobility; Pain Descriptors / Indicators: Aching;Sharp;Shooting Pain Intervention(s): Limited activity within patient's tolerance;Monitored during session;Repositioned;Patient requesting pain meds-RN notified    Home Living Family/patient expects to be discharged to:: Private residence Living Arrangements: Spouse/significant other Available Help at  Discharge: Family;Available PRN/intermittently Type of Home: House Home Access: Stairs to enter Entrance Stairs-Rails: Psychiatric nurse of Steps: 3 STE Home Layout: One level Home Equipment: Walker - 2 wheels;Shower seat      Prior Function Level of  Independence: Independent               Hand Dominance   Dominant Hand: Right    Extremity/Trunk Assessment   Upper Extremity Assessment Upper Extremity Assessment: Overall WFL for tasks assessed    Lower Extremity Assessment Lower Extremity Assessment: RLE deficits/detail;LLE deficits/detail RLE Deficits / Details: grossly 4/5 strength RLE: Unable to fully assess due to pain RLE Sensation: decreased light touch;history of peripheral neuropathy (numbness/tingling in toes due to neuropathy;) RLE Coordination: WNL LLE Deficits / Details: grossly 4/5 strength; LLE Sensation: history of peripheral neuropathy LLE Coordination: WNL    Cervical / Trunk Assessment Cervical / Trunk Assessment: Other exceptions Cervical / Trunk Exceptions: in standing, patient able to achieve neutral posture with severe increase in back pain; He reports less discomfort with flexed lumbar spine;  Communication   Communication: No difficulties  Cognition Arousal/Alertness: Awake/alert Behavior During Therapy: WFL for tasks assessed/performed Overall Cognitive Status: Within Functional Limits for tasks assessed                                        General Comments General comments (skin integrity, edema, etc.): no edema noted;    Exercises     Assessment/Plan    PT Assessment Patient needs continued PT services  PT Problem List Decreased strength;Decreased mobility;Decreased safety awareness;Decreased range of motion;Decreased activity tolerance;Pain       PT Treatment Interventions DME instruction;Gait training;Stair training;Functional mobility training;Therapeutic activities;Therapeutic exercise;Patient/family education    PT Goals (Current goals can be found in the Care Plan section)  Acute Rehab PT Goals Patient Stated Goal: to go home and reduce pain; PT Goal Formulation: With patient Time For Goal Achievement: 08/01/20 Potential to Achieve Goals: Good     Frequency Min 2X/week   Barriers to discharge Inaccessible home environment 3 steps to enter with B rails;    Co-evaluation               AM-PAC PT "6 Clicks" Mobility  Outcome Measure Help needed turning from your back to your side while in a flat bed without using bedrails?: None Help needed moving from lying on your back to sitting on the side of a flat bed without using bedrails?: None Help needed moving to and from a bed to a chair (including a wheelchair)?: A Little Help needed standing up from a chair using your arms (e.g., wheelchair or bedside chair)?: A Little Help needed to walk in hospital room?: A Little Help needed climbing 3-5 steps with a railing? : A Lot 6 Click Score: 19    End of Session Equipment Utilized During Treatment: Gait belt Activity Tolerance: Patient limited by pain Patient left: in bed;with bed alarm set;with nursing/sitter in room;with call bell/phone within reach Nurse Communication: Mobility status PT Visit Diagnosis: Muscle weakness (generalized) (M62.81);Pain Pain - part of body:  (back pain)    Time: 6283-6629 PT Time Calculation (min) (ACUTE ONLY): 24 min   Charges:   PT Evaluation $PT Eval Low Complexity: 1 Low            Annalese Stiner PT, DPT 07/18/2020, 2:10 PM

## 2020-07-18 NOTE — Evaluation (Signed)
Occupational Therapy Evaluation Patient Details Name: Larry Russell MRN: [id number] DOB: January 21, 1956 Today's Date: 07/18/2020    History of Present Illness 65 y.o. male with medical history significant for arthritis and psoriasis who presented to the ER for evaluation of low back pain which he has had for about a week. Pt with small disk protrusion L5- S1.   Clinical Impression   Upon entering the room, pt seated on EOB with RN present giving medications. Pt is pleasant and cooperative. He reports living at home with his wife and being Independent at baseline. Pt with significant pain with mobility this session. He is able to demonstrate figure four position for LB dressing and OT encouraged pt to utilize back precautions for comfort. Pt stands and ambulates ~ 15 feet with RW and reports 10/10 pain with more flexed posture and needing cuing for upright position. OT recommended 3 in 1 for home for safety with toileting needs and pt is agreeable. Pt reports family available to provide supervision and assist as needed. Pt does not need further skilled OT intervention at this time. OT to SIGN OFF. Thanks you for this referral.     Follow Up Recommendations  No OT follow up;Supervision - Intermittent    Equipment Recommendations  3 in 1 bedside commode       Precautions / Restrictions Precautions Precaution Comments: back precautions for comfort Restrictions Weight Bearing Restrictions: No      Mobility Bed Mobility Overal bed mobility: Modified Independent             General bed mobility comments: no physical assistance provided.    Transfers Overall transfer level: Needs assistance Equipment used: Rolling walker (2 wheeled) Transfers: Sit to/from [facility] Sit to Stand: Supervision Stand pivot transfers: Supervision       General transfer comment: cuing for upright posture and supervision for safety with pt having increased back pain with mobility  tasks.    Balance Overall balance assessment: Needs assistance Sitting-balance support: Feet supported Sitting balance-Leahy Scale: Normal     Standing balance support: During functional activity Standing balance-Leahy Scale: Fair Standing balance comment: reliance of RW for UE support                           ADL either performed or assessed with clinical judgement   ADL                                         General ADL Comments: Pt demonstrates figure four positon without assistance and OT recommended pt utilize this for LB self care for pain management. Pt overall supervision this session with mobility and self care tasks with use of RW but is limited secondary to pain.     Vision Patient Visual Report: No change from baseline              Pertinent Vitals/Pain Pain Assessment: 0-10 Pain Score: 10-Worst pain ever Pain Location: back with mobility Pain Descriptors / Indicators: Discomfort;Aching;Grimacing Pain Intervention(s): Limited activity within patient's tolerance;Monitored during session;Premedicated before session;Repositioned     Hand Dominance Right   Extremity/Trunk Assessment Upper Extremity Assessment Upper Extremity Assessment: Overall WFL for tasks assessed   Lower Extremity Assessment Lower Extremity Assessment: Overall WFL for tasks assessed       Communication Communication Communication: No difficulties   Cognition Arousal/Alertness: Awake/alert Behavior  During Therapy: WFL for tasks assessed/performed Overall Cognitive Status: Within Functional Limits for tasks assessed                                                Home Living Family/patient expects to be discharged to:: Private residence Living Arrangements: Spouse/significant other Available Help at Discharge: Family;Available PRN/intermittently Type of Home: House Home Access: Stairs to enter Entrance Stairs-Number of Steps: 3  STE Entrance Stairs-Rails: Right;Left Home Layout: One level     Bathroom Shower/Tub: Occupational psychologist: Standard     Home Equipment: Environmental consultant - 2 wheels;Shower seat          Prior Functioning/Environment Level of Independence: Independent                          OT Goals(Current goals can be found in the care plan section) Acute Rehab OT Goals Patient Stated Goal: to go home and move better OT Goal Formulation: With patient Time For Goal Achievement: 07/18/20 Potential to Achieve Goals: Good   AM-PAC OT "6 Clicks" Daily Activity     Outcome Measure Help from another person eating meals?: None Help from another person taking care of personal grooming?: None Help from another person toileting, which includes using toliet, bedpan, or urinal?: A Little Help from another person bathing (including washing, rinsing, drying)?: A Little Help from another person to put on and taking off regular upper body clothing?: None Help from another person to put on and taking off regular lower body clothing?: A Little 6 Click Score: 21   End of Session Equipment Utilized During Treatment: Rolling walker Nurse Communication: Mobility status  Activity Tolerance: Patient tolerated treatment well;Patient limited by pain Patient left: in bed;with call bell/phone within reach;with bed alarm set  OT Visit Diagnosis: Pain Pain - part of body:  (back)                Time: 1015-1040 OT Time Calculation (min): 25 min Charges:  OT General Charges $OT Visit: 1 Visit OT Evaluation $OT Eval Low Complexity: 1 Low OT Treatments $Self Care/Home Management : 8-22 mins  Darleen Crocker, MS, OTR/L , CBIS ascom [phone]  07/18/20, 11:45 AM

## 2020-07-18 NOTE — TOC Progression Note (Signed)
Transition of Care [facility]) - Progression Note    Patient Details  Name: Larry Russell MRN: [id number] Date of Birth: [birth year]  Transition of Care [facility]) CM/SW [place], RN Phone Number: 07/18/2020, 1:21 PM  Clinical Narrative:   Met with the patient in the room to discuss DC plan and needs I have reached out to [facility] to see if they can accept the patient for hh services, she will let me know The patient has a RW at home but needs a 3 in 1, I notified [employer] and it will be brought into the room prior to DC, his wife provides transportation, he can afford his medications         Expected Discharge Plan and Services                                                 Social Determinants of Health (SDOH) Interventions    Readmission Risk Interventions No flowsheet data found.

## 2020-07-18 NOTE — Progress Notes (Addendum)
[facility] PROGRESS NOTE   Larry Russell EUM:[id number] DOB: [birth year] DOA: 07/16/2020  PCP: Rusty Aus, MD  Brief History/Interval Summary: 64 y.o. male with medical history significant for arthritis and psoriasis who presented to the ER for evaluation of low back pain which he has had for about a week. Patient has a history of arthritis involving his lower back but states that he developed pain in his back about a week prior to admission after he lifted a lawnmower.  He had difficulty ambulating.  Had 10 out of 10 pain.  Did not have any red flag symptoms such as urinary or fecal incontinence, fever chills, no saddle anesthesia.  He presented to the ED.  Underwent MRI of the lumbar spine which showed foraminal extrusion in the L4-5 area impinging on the right L4 nerve root.  Chronic degeneration was noted which was focally advanced with high-grade spinal stenosis.  Reason for Visit: Low back pain  Consultants: Neurosurgery  Procedures: None yet  Antibiotics: Anti-infectives (From admission, onward)   None      Subjective/Interval History: Patient continues to have significant discomfort in his back and decreased ability to ambulate and move around.  Has not noticed any significant improvement in the last 24 hours.      Assessment/Plan:  Low back pain with concern for nerve impingement of L4 with high-grade spinal stenosis with lumbar radiculitis Patient seen by neurosurgery, Dr. Lacinda Axon.  Medical management recommended by neurosurgery.  They agree with continuing steroids and getting patient to be seen by PT and OT. Based on previous records it appears that patient has a longstanding history of lumbar issues.  Is followed by Dr. Sharlet Salina who is a PMR specialist and has administered injections to patient's back. Patient was told that once he has better from this acute episode he will need to go and see this provider again. We will continue with narcotic pain  medications.  We will change his muscle relaxants to Robaxin.  We will also add gabapentin.  Leave him on Solu-Medrol for another day. PT and OT evaluation.  History of psoriasis Followed by dermatology in the outpatient setting.  DVT Prophylaxis: Initiate Lovenox Code Status: Full code Family Communication: Discussed with the patient Disposition Plan: Hopefully return home when improved.  Status is: Inpatient  Remains inpatient appropriate because:Ongoing active pain requiring inpatient pain management   Dispo:  Patient From: Home  Planned Disposition: Home  Medically stable for discharge: No          Medications:  Scheduled: . cyclobenzaprine  10 mg Oral TID  . enoxaparin (LOVENOX) injection  40 mg Subcutaneous Q24H  . lidocaine  1 patch Transdermal Q24H  . methylPREDNISolone (SOLU-MEDROL) injection  60 mg Intravenous Daily  . pantoprazole  40 mg Oral QHS  . senna-docusate  2 tablet Oral QHS  . sodium chloride flush  3 mL Intravenous Q12H   Continuous: . sodium chloride     VQM:GQQPYP chloride, acetaminophen **OR** acetaminophen, HYDROcodone-acetaminophen, morphine injection, ondansetron **OR** ondansetron (ZOFRAN) IV, polyethylene glycol, sodium chloride flush   Objective:  Vital Signs  Vitals:   07/17/20 2320 07/18/20 0429 07/18/20 0613 07/18/20 0819  BP: (!) 147/94 (!) 145/101 (!) 145/93 (!) 142/105  Pulse: 67 69 66 72  Resp: 20 20  16   Temp: 97.8 F (36.6 C) 97.9 F (36.6 C)  97.6 F (36.4 C)  TempSrc: Oral Oral    SpO2: 93% 94%  97%  Weight:      Height:  Intake/Output Summary (Last 24 hours) at 07/18/2020 1056 Last data filed at 07/18/2020 1033 Gross per 24 hour  Intake 483 ml  Output 1650 ml  Net -1167 ml   Filed Weights   07/16/20 0752 07/16/20 0759  Weight: 88.9 kg 88.9 kg    General appearance: Awake alert.  In no distress Resp: Clear to auscultation bilaterally.  Normal effort Cardio: S1-S2 is normal regular.  No S3-S4.  No  rubs murmurs or bruit GI: Abdomen is soft.  Nontender nondistended.  Bowel sounds are present normal.  No masses organomegaly Extremities: Able to lift both his legs of the bed. Neurologic: Alert and oriented x3.  No focal neurological deficits.      Lab Results:  Data Reviewed: I have personally reviewed following labs and imaging studies  CBC: Recent Labs  Lab 07/16/20 0829 07/17/20 0512  WBC 9.3 14.3*  NEUTROABS 5.5  --   HGB 14.7 14.5  HCT 41.9 41.0  MCV 92.7 91.9  PLT 209 235    Basic Metabolic Panel: Recent Labs  Lab 07/16/20 0829 07/17/20 0512  NA 138 137  K 3.7 4.2  CL 104 102  CO2 25 25  GLUCOSE 106* 123*  BUN 18 22  CREATININE 0.92 1.01  CALCIUM 9.3 9.2    GFR: Estimated Creatinine Clearance: 79 mL/min (by C-G formula based on SCr of 1.01 mg/dL).  Liver Function Tests: Recent Labs  Lab 07/16/20 0829  AST 26  ALT 36  ALKPHOS 57  BILITOT 0.9  PROT 8.3*  ALBUMIN 4.3     Recent Results (from the past 240 hour(s))  Resp Panel by RT-PCR (Flu A&B, Covid) Nasopharyngeal Swab     Status: None   Collection Time: 07/16/20  2:25 PM   Specimen: Nasopharyngeal Swab; Nasopharyngeal(NP) swabs in vial transport medium  Result Value Ref Range Status   SARS Coronavirus 2 by RT PCR NEGATIVE NEGATIVE Final    Comment: (NOTE) SARS-CoV-2 target nucleic acids are NOT DETECTED.  The SARS-CoV-2 RNA is generally detectable in upper respiratory specimens during the acute phase of infection. The lowest concentration of SARS-CoV-2 viral copies this assay can detect is 138 copies/mL. A negative result does not preclude SARS-Cov-2 infection and should not be used as the sole basis for treatment or other patient management decisions. A negative result may occur with  improper specimen collection/handling, submission of specimen other than nasopharyngeal swab, presence of viral mutation(s) within the areas targeted by this assay, and inadequate number of  viral copies(<138 copies/mL). A negative result must be combined with clinical observations, patient history, and epidemiological information. The expected result is Negative.  Fact Sheet for Patients:  EntrepreneurPulse.com.au  Fact Sheet for Healthcare Providers:  IncredibleEmployment.be  This test is no t yet approved or cleared by the [place] FDA and  has been authorized for detection and/or diagnosis of SARS-CoV-2 by FDA under an Emergency Use Authorization (EUA). This EUA will remain  in effect (meaning this test can be used) for the duration of the COVID-19 declaration under Section 564(b)(1) of the Act, 21 U.S.C.section 360bbb-3(b)(1), unless the authorization is terminated  or revoked sooner.       Influenza A by PCR NEGATIVE NEGATIVE Final   Influenza B by PCR NEGATIVE NEGATIVE Final    Comment: (NOTE) The Xpert Xpress SARS-CoV-2/FLU/RSV plus assay is intended as an aid in the diagnosis of influenza from Nasopharyngeal swab specimens and should not be used as a sole basis for treatment. Nasal washings and aspirates are unacceptable  for Xpert Xpress SARS-CoV-2/FLU/RSV testing.  Fact Sheet for Patients: EntrepreneurPulse.com.au  Fact Sheet for Healthcare Providers: IncredibleEmployment.be  This test is not yet approved or cleared by the [place] FDA and has been authorized for detection and/or diagnosis of SARS-CoV-2 by FDA under an Emergency Use Authorization (EUA). This EUA will remain in effect (meaning this test can be used) for the duration of the COVID-19 declaration under Section 564(b)(1) of the Act, 21 U.S.C. section 360bbb-3(b)(1), unless the authorization is terminated or revoked.  Performed at [facility], [address], [place], [place] [address]       Radiology Studies: No results found.     LOS: 1 day   Ardie Mclennan [employer]  on [email or web address]  07/18/2020, 10:56 AM

## 2020-07-19 DIAGNOSIS — M5441 Lumbago with sciatica, right side: Secondary | ICD-10-CM

## 2020-07-19 MED ORDER — METHOCARBAMOL 500 MG PO TABS
500.0000 mg | ORAL_TABLET | Freq: Four times a day (QID) | ORAL | Status: DC | PRN
  Administered 2020-07-19: 500 mg via ORAL
  Filled 2020-07-19: qty 1

## 2020-07-19 MED ORDER — SENNOSIDES-DOCUSATE SODIUM 8.6-50 MG PO TABS: 2.0000 | ORAL_TABLET | Freq: Every day | ORAL | 0 refills | Status: DC

## 2020-07-19 MED ORDER — METHOCARBAMOL 500 MG PO TABS: 500.0000 mg | ORAL_TABLET | Freq: Four times a day (QID) | ORAL | 0 refills | Status: DC | PRN

## 2020-07-19 MED ORDER — POLYETHYLENE GLYCOL 3350 17 G PO PACK: 17.0000 g | PACK | Freq: Every day | ORAL | 0 refills | Status: DC | PRN

## 2020-07-19 MED ORDER — GABAPENTIN 600 MG PO TABS: 300.0000 mg | ORAL_TABLET | Freq: Two times a day (BID) | ORAL | 0 refills | Status: DC

## 2020-07-19 MED ORDER — HYDROCODONE-ACETAMINOPHEN 5-325 MG PO TABS: 1.0000 | ORAL_TABLET | Freq: Four times a day (QID) | ORAL | 0 refills | Status: DC | PRN

## 2020-07-19 MED ORDER — PREDNISONE 20 MG PO TABS: ORAL_TABLET | ORAL | 0 refills | Status: DC

## 2020-07-19 NOTE — Discharge Instructions (Signed)
Radicular Pain Radicular pain is a type of pain that spreads from your back or neck along a spinal nerve. Spinal nerves are nerves that leave the spinal cord and go to the muscles. Radicular pain is sometimes called radiculopathy, radiculitis, or a pinched nerve. When you have this type of pain, you may also have weakness, numbness, or tingling in the area of your body that is supplied by the nerve. The pain may feel sharp and burning. Depending on which spinal nerve is affected, the pain may occur in the:  Neck area (cervical radicular pain). You may also feel pain, numbness, weakness, or tingling in the arms.  Mid-spine area (thoracic radicular pain). You would feel this pain in the back and chest. This type is rare.  Lower back area (lumbar radicular pain). You would feel this pain as low back pain. You may feel pain, numbness, weakness, or tingling in the buttocks or legs. Sciatica is a type of lumbar radicular pain that shoots down the back of the leg. Radicular pain occurs when one of the spinal nerves becomes irritated or squeezed (compressed). It is often caused by something pushing on a spinal nerve, such as one of the bones of the spine (vertebrae) or one of the round cushions between vertebrae (intervertebral disks). This can result from:  An injury.  Wear and tear or aging of a disk.  The growth of a bone spur that pushes on the nerve. Radicular pain often goes away when you follow instructions from your health care provider for relieving pain at home. Follow these instructions at home: Managing pain  If directed, put ice on the affected area: ? Put ice in a plastic bag. ? Place a towel between your skin and the bag. ? Leave the ice on for 20 minutes, 2-3 times a day.  If directed, apply heat to the affected area as often as told by your health care provider. Use the heat source that your health care provider recommends, such as a moist heat pack or a heating pad. ? Place a towel  between your skin and the heat source. ? Leave the heat on for 20-30 minutes. ? Remove the heat if your skin turns bright red. This is especially important if you are unable to feel pain, heat, or cold. You may have a greater risk of getting burned.      Activity  Do not sit or rest in bed for long periods of time.  Try to stay as active as possible. Ask your health care provider what type of exercise or activity is best for you.  Avoid activities that make your pain worse, such as bending and lifting.  Do not lift anything that is heavier than 10 lb (4.5 kg), or the limit that you are told, until your health care provider says that it is safe.  Practice using proper technique when lifting items. Proper lifting technique involves bending your knees and rising up.  Do strength and range-of-motion exercises only as told by your health care provider or physical therapist.   General instructions  Take over-the-counter and prescription medicines only as told by your health care provider.  Pay attention to any changes in your symptoms.  Keep all follow-up visits as told by your health care provider. This is important. ? Your health care provider may send you to a physical therapist to help with this pain. Contact a health care provider if:  Your pain and other symptoms get worse.  Your pain medicine   is not helping.  Your pain has not improved after a few weeks of home care.  You have a fever. Get help right away if:  You have severe pain, weakness, or numbness.  You have difficulty with bladder or bowel control. Summary  Radicular pain is a type of pain that spreads from your back or neck along a spinal nerve.  When you have radicular pain, you may also have weakness, numbness, or tingling in the area of your body that is supplied by the nerve.  The pain may feel sharp or burning.  Radicular pain may be treated with ice, heat, medicines, or physical therapy. This  information is not intended to replace advice given to you by your health care provider. Make sure you discuss any questions you have with your health care provider. Document Revised: 08/20/2017 Document Reviewed: 08/20/2017 Elsevier Patient Education  2021 Elsevier Inc.  

## 2020-07-19 NOTE — Discharge Summary (Signed)
[facility]  Physician Discharge Summary   Patient ID: Larry Russell MRN: [id number] DOB/AGE: December 02, 1955 65 y.o.  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 Patient to follow-up with Dr. Sharlet Salina, PMR, who he has seen before for injections to his back 2. Follow-up with neurosurgery in 4 to 5 weeks    Home Health: Home health PT and OT Equipment/Devices: None  CODE STATUS: Full code  DISCHARGE CONDITION: fair  Diet recommendation: As before  INITIAL HISTORY: 64 y.o.malewith medical history significant forarthritis and psoriasis who presented to the ER for evaluation of low back pain which he has had for about a week. Patient has a history of arthritis involving his lower back but states that he developed pain in his back about a week prior to admission after he lifted a lawnmower.  He had difficulty ambulating.  Had 10 out of 10 pain.  Did not have any red flag symptoms such as urinary or fecal incontinence, fever chills, no saddle anesthesia.  He presented to the ED.  Underwent MRI of the lumbar spine which showed foraminal extrusion in the L4-5 area impinging on the right L4 nerve root.  Chronic degeneration was noted which was focally advanced with high-grade spinal stenosis.   Consultations:  Dr. Lacinda Axon with neurosurgery  Procedures:  None   HOSPITAL COURSE:   Low back pain with concern for nerve impingement of L4 with high-grade spinal stenosis with lumbar radiculitis Patient seen by neurosurgery, Dr. Lacinda Axon.  Medical management recommended by neurosurgery.  They agree with continuing steroids and getting patient to be seen by PT and OT. Based on previous records it appears that patient has a longstanding history of lumbar issues.     Patient is followed by Dr. Sharlet Salina who is a PMR specialist and has administered injections to patient's back previously. Patient was told that once he has better from this acute episode he will need to go and see this provider again. Patient was started on oral narcotics along with muscle relaxants and gabapentin.  Feels better today.  Has been able to ambulate with PT.  Continues to have pain issues though.  But since there is improvement he is considered stable for discharge.  He will be discharged on a steroid taper.  History of psoriasis Followed by dermatology in the outpatient setting.   Patient is stable.  Feels better.  Agreeable to discharge today.  Home health will be ordered.   PERTINENT LABS:  The results of significant diagnostics from this hospitalization (including imaging, microbiology, ancillary and laboratory) are listed below for reference.    Microbiology: Recent Results (from the past 240 hour(s))  Resp Panel by RT-PCR (Flu A&B, Covid) Nasopharyngeal Swab     Status: None   Collection Time: 07/16/20  2:25 PM   Specimen: Nasopharyngeal Swab; Nasopharyngeal(NP) swabs in vial transport medium  Result Value Ref Range Status   SARS Coronavirus 2 by RT PCR NEGATIVE NEGATIVE Final    Comment: (NOTE) SARS-CoV-2 target nucleic acids are NOT DETECTED.  The SARS-CoV-2 RNA is generally detectable in upper respiratory specimens during the acute phase of infection. The lowest concentration of SARS-CoV-2 viral copies this assay can detect is 138 copies/mL. A negative result does not preclude SARS-Cov-2 infection and should not be used as the sole basis for treatment  or other patient management decisions. A negative result may occur with  improper specimen collection/handling, submission of specimen other than nasopharyngeal swab, presence of viral mutation(s) within the areas targeted by this assay, and inadequate number of viral copies(<138 copies/mL). A negative result  must be combined with clinical observations, patient history, and epidemiological information. The expected result is Negative.  Fact Sheet for Patients:  EntrepreneurPulse.com.au  Fact Sheet for Healthcare Providers:  IncredibleEmployment.be  This test is no t yet approved or cleared by the [place] FDA and  has been authorized for detection and/or diagnosis of SARS-CoV-2 by FDA under an Emergency Use Authorization (EUA). This EUA will remain  in effect (meaning this test can be used) for the duration of the COVID-19 declaration under Section 564(b)(1) of the Act, 21 U.S.C.section 360bbb-3(b)(1), unless the authorization is terminated  or revoked sooner.       Influenza A by PCR NEGATIVE NEGATIVE Final   Influenza B by PCR NEGATIVE NEGATIVE Final    Comment: (NOTE) The Xpert Xpress SARS-CoV-2/FLU/RSV plus assay is intended as an aid in the diagnosis of influenza from Nasopharyngeal swab specimens and should not be used as a sole basis for treatment. Nasal washings and aspirates are unacceptable for Xpert Xpress SARS-CoV-2/FLU/RSV testing.  Fact Sheet for Patients: EntrepreneurPulse.com.au  Fact Sheet for Healthcare Providers: IncredibleEmployment.be  This test is not yet approved or cleared by the [place] FDA and has been authorized for detection and/or diagnosis of SARS-CoV-2 by FDA under an Emergency Use Authorization (EUA). This EUA will remain in effect (meaning this test can be used) for the duration of the COVID-19 declaration under Section 564(b)(1) of the Act, 21 U.S.C. section 360bbb-3(b)(1), unless the authorization is terminated or revoked.  Performed at [facility], [address] [place], [place] [address]      Labs:  COVID-19 Labs    Lab Results  Component Value Date   [place] NEGATIVE 07/16/2020      Basic Metabolic Panel: Recent Labs  Lab  07/16/20 0829 07/17/20 0512  NA 138 137  K 3.7 4.2  CL 104 102  CO2 25 25  GLUCOSE 106* 123*  BUN 18 22  CREATININE 0.92 1.01  CALCIUM 9.3 9.2   Liver Function Tests: Recent Labs  Lab 07/16/20 0829  AST 26  ALT 36  ALKPHOS 57  BILITOT 0.9  PROT 8.3*  ALBUMIN 4.3   CBC: Recent Labs  Lab 07/16/20 0829 07/17/20 0512  WBC 9.3 14.3*  NEUTROABS 5.5  --   HGB 14.7 14.5  HCT 41.9 41.0  MCV 92.7 91.9  PLT 209 232     IMAGING STUDIES MR LUMBAR SPINE WO CONTRAST  Result Date: 07/16/2020 CLINICAL DATA:  Low back pain with progressive neurologic deficit. Infection suspected EXAM: MRI LUMBAR SPINE WITHOUT CONTRAST TECHNIQUE: Multiplanar, multisequence MR imaging of the lumbar spine was performed. No intravenous contrast was administered. COMPARISON:  09/10/2018 FINDINGS: Segmentation:  5 lumbar type vertebrae Alignment:  Grade 1 anterolisthesis at L4-5 Vertebrae:  No fracture, evidence of discitis, or bone lesion. Conus medullaris and cauda equina: Conus extends to the L1 level. Conus and cauda equina appear normal. Paraspinal and other soft tissues: Proteinaceous retention cyst appearance in the right kidney with layering hematocrit. Disc levels: T12- L1: Unremarkable. L1-L2: Mild disc narrowing and bulging with ventral spurring L2-L3: Mild facet spurring L3-L4: Mild facet spurring and annulus bulging L4-L5: Advanced facet osteoarthritis with spurring and joint distortion. There is interspinous ganglion and anterolisthesis. The disc is  narrowed and bulging with annular fissuring and shallow central protrusion. Progressed and advanced spinal stenosis. There is a new right foraminal extrusion impinging on the exiting L4 nerve root. L5-S1:Small left paracentral protrusion contacting but not compressing the left S1 nerve root. Mild facet spurring. IMPRESSION: 1. Dominant findings at L4-5 where there is a right foraminal extrusion impinging on the right L4 nerve root. Chronic degeneration at  this level is focally advanced and progressed from 2020 with high-grade spinal stenosis. 2. Noncompressive degenerative changes at the other levels is stable and described above. Electronically Signed   By: Monte Fantasia M.D.   On: 07/16/2020 10:51   DG Hip Unilat W or Wo Pelvis 2-3 Views Right  Result Date: 07/16/2020 CLINICAL DATA:  Pain radiating to the right hip and leg EXAM: DG HIP (WITH OR WITHOUT PELVIS) 2-3V RIGHT COMPARISON:  None. FINDINGS: There is no evidence of hip fracture or dislocation. There is no evidence of arthropathy or other focal bone abnormality. IMPRESSION: Negative. Electronically Signed   By: Monte Fantasia M.D.   On: 07/16/2020 09:30    DISCHARGE EXAMINATION: Vitals:   07/18/20 1941 07/18/20 2347 07/19/20 0348 07/19/20 0803  BP: (!) 153/100 (!) 137/100 (!) 151/96 (!) 149/109  Pulse:  83 77 82  Resp: 16 16 18 20   Temp: 98.7 F (37.1 C) 98.7 F (37.1 C) 98.1 F (36.7 C) 97.9 F (36.6 C)  TempSrc: Oral Oral Oral Oral  SpO2: 91% 94% 95% 95%  Weight:      Height:       General appearance: Awake alert.  In no distress Resp: Clear to auscultation bilaterally.  Normal effort Cardio: S1-S2 is normal regular.  No S3-S4.  No rubs murmurs or bruit GI: Abdomen is soft.  Nontender nondistended.  Bowel sounds are present normal.  No masses organomegaly Extremities: No edema.  Able to lift both legs off the bed. Neurologic: Alert and oriented x3.  No focal neurological deficits.    DISPOSITION: Home  Discharge Instructions    Call MD for:  difficulty breathing, headache or visual disturbances   Complete by: As directed    Call MD for:  extreme fatigue   Complete by: As directed    Call MD for:  persistant dizziness or light-headedness   Complete by: As directed    Call MD for:  persistant nausea and vomiting   Complete by: As directed    Call MD for:  severe uncontrolled pain   Complete by: As directed    Call MD for:  temperature >100.4   Complete by: As  directed    Diet general   Complete by: As directed    Discharge instructions   Complete by: As directed    Please be sure to follow-up with Dr. Sharlet Salina for consideration of injections to your lower back.  Take your medications as prescribed.    You were cared for by a hospitalist during your hospital stay. If you have any questions about your discharge medications or the care you received while you were in the hospital after you are discharged, you can call the unit and asked to speak with the hospitalist on call if the hospitalist that took care of you is not available. Once you are discharged, your primary care physician will handle any further medical issues. Please note that NO REFILLS for any discharge medications will be authorized once you are discharged, as it is imperative that you return to your primary care physician (or establish a relationship  with a primary care physician if you do not have one) for your aftercare needs so that they can reassess your need for medications and monitor your lab values. If you do not have a primary care physician, you can call [phone] for a physician referral.   Increase activity slowly   Complete by: As directed         Allergies as of 07/19/2020      Reactions   Other Itching   Celebrex [celecoxib] Itching   Kiwi Extract Itching   Meloxicam Swelling   Shellfish Allergy Hives, Itching      Medication List    TAKE these medications   diclofenac 75 MG EC tablet Commonly known as: VOLTAREN Take 75 mg by mouth 2 (two) times daily.   gabapentin 600 MG tablet Commonly known as: NEURONTIN Take 0.5 tablets (300 mg total) by mouth 2 (two) times daily.   Humira 40 MG/0.4ML Pskt Generic drug: Adalimumab Inject 40 mg into the skin every 14 (fourteen) days. For maintenance.   HYDROcodone-acetaminophen 5-325 MG tablet Commonly known as: NORCO/VICODIN Take 1 tablet by mouth every 6 (six) hours as needed for severe pain.   methocarbamol 500 MG  tablet Commonly known as: ROBAXIN Take 1 tablet (500 mg total) by mouth every 6 (six) hours as needed for muscle spasms.   mometasone 0.1 % cream Commonly known as: ELOCON Apply 1 application topically daily as needed (Rash).   mometasone 0.1 % lotion Commonly known as: ELOCON Apply to scalp 5 nights a week   omeprazole 40 MG capsule Commonly known as: PRILOSEC Take 40 mg by mouth every other day.   polyethylene glycol 17 g packet Commonly known as: MIRALAX / GLYCOLAX Take 17 g by mouth daily as needed for moderate constipation.   pravastatin 20 MG tablet Commonly known as: PRAVACHOL Take 20 mg by mouth every other day.   predniSONE 20 MG tablet Commonly known as: DELTASONE Take 3 tablets once daily for 3 days followed by 2 tablets once daily for 3 days followed by 1 tablet once daily for 3 days and then stop What changed:   how much to take  how to take this  when to take this  additional instructions   senna-docusate 8.6-50 MG tablet Commonly known as: Senokot-S Take 2 tablets by mouth at bedtime.         Follow-up Information    Rusty Aus, MD. Schedule an appointment as soon as possible for a visit in 1 week(s).   Specialty: Internal Medicine Contact information: [facility] [place] [address] [phone]        Sharlet Salina, MD. Schedule an appointment as soon as possible for a visit in 1 week(s).   Specialty: Physical Medicine and Rehabilitation Contact information: [place] [address] [phone]        Deetta Perla, MD Follow up in 4 week(s).   Specialty: Neurosurgery Contact information: [place] [address] [phone]               TOTAL DISCHARGE TIME: 35 minutes  [facility] Pager on [email or web address]  07/19/2020, 12:57 PM

## 2020-07-21 NOTE — Plan of Care (Signed)
Patient discharged home per MD orders at this time.All discharge instructions,education and medications reviewed with patient at bedside.Pt expressed understanding and will comply with dc instructions.Follow up appointments also communicated to patient.no verbal c/o or any ssx of distress.patient discharged home with HH/PT services.Patient transported home in a private car by significant other.

## 2020-08-03 ENCOUNTER — Other Ambulatory Visit: Payer: Self-pay | Admitting: Neurosurgery

## 2020-08-17 NOTE — Pre-Procedure Instructions (Signed)
Surgical Instructions    Your procedure is scheduled on Wednesday July 6th.  Report to [facility] Main Entrance "A" at 10:55 A.M., then check in with the Admitting office.  Call this number if you have problems the morning of surgery:  [phone]   If you have any questions prior to your surgery date call [phone]: Open Monday-Friday 8am-4pm    Remember:  Do not eat or drink after midnight the night before your surgery     Take these medicines the morning of surgery with A SIP OF WATER   gabapentin (NEURONTIN)   HYDROcodone-acetaminophen (NORCO/VICODIN)- If needed  methocarbamol (ROBAXIN)- If needed  omeprazole (PRILOSEC)  pravastatin (PRAVACHOL)  predniSONE (DELTASONE)    As of today, STOP taking any Aspirin (unless otherwise instructed by your surgeon) Aleve, Naproxen, Ibuprofen, Motrin, Advil, Goody's, BC's, all herbal medications, fish oil, and all vitamins.                     Do NOT Smoke (Tobacco/Vaping) or drink Alcohol 24 hours prior to your procedure.  If you use a CPAP at night, you may bring all equipment for your overnight stay.   Contacts, glasses, piercing's, hearing aid's, dentures or partials may not be worn into surgery, please bring cases for these belongings.    For patients admitted to the hospital, discharge time will be determined by your treatment team.   Patients discharged the day of surgery will not be allowed to drive home, and someone needs to stay with them for 24 hours.  ONLY 1 SUPPORT PERSON MAY BE PRESENT WHILE YOU ARE IN SURGERY. IF YOU ARE TO BE ADMITTED ONCE YOU ARE IN YOUR ROOM YOU WILL BE ALLOWED TWO (2) VISITORS.  Minor children may have two parents present. Special consideration for safety and communication needs will be reviewed on a case by case basis.   Special instructions:   [facility]- Preparing For Surgery  Before surgery, you can play an important role. Because skin is not sterile, your skin needs to be as free of germs  as possible. You can reduce the number of germs on your skin by washing with CHG (chlorahexidine gluconate) Soap before surgery.  CHG is an antiseptic cleaner which kills germs and bonds with the skin to continue killing germs even after washing.    Oral Hygiene is also important to reduce your risk of infection.  Remember - BRUSH YOUR TEETH THE MORNING OF SURGERY WITH YOUR REGULAR TOOTHPASTE  Please do not use if you have an allergy to CHG or antibacterial soaps. If your skin becomes reddened/irritated stop using the CHG.  Do not shave (including legs and underarms) for at least 48 hours prior to first CHG shower. It is OK to shave your face.  Please follow these instructions carefully.   Shower the NIGHT BEFORE SURGERY and the MORNING OF SURGERY  If you chose to wash your hair, wash your hair first as usual with your normal shampoo.  After you shampoo, rinse your hair and body thoroughly to remove the shampoo.  Use CHG Soap as you would any other liquid soap. You can apply CHG directly to the skin and wash gently with a scrungie or a clean washcloth.   Apply the CHG Soap to your body ONLY FROM THE NECK DOWN.  Do not use on open wounds or open sores. Avoid contact with your eyes, ears, mouth and genitals (private parts). Wash Face and genitals (private parts)  with your normal soap.  Wash thoroughly, paying special attention to the area where your surgery will be performed.  Thoroughly rinse your body with warm water from the neck down.  DO NOT shower/wash with your normal soap after using and rinsing off the CHG Soap.  Pat yourself dry with a CLEAN TOWEL.  Wear CLEAN PAJAMAS to bed the night before surgery  Place CLEAN SHEETS on your bed the night before your surgery  DO NOT SLEEP WITH PETS.   Day of Surgery: Shower with CHG soap. Do not wear jewelry, make up, nail polish, gel polish, artificial nails, or any other type of covering on natural nails including finger and  toenails. If patients have artificial nails, gel coating, etc. that need to be removed by a nail salon please have this removed prior to surgery. Surgery may need to be canceled/delayed if the surgeon/ anesthesia feels like the patient is unable to be adequately monitored. Do not wear lotions, powders, perfumes/colognes, or deodorant. Do not shave 48 hours prior to surgery.  Men may shave face and neck. Do not bring valuables to the hospital. [facility] is not responsible for any belongings or valuables. Wear Clean/Comfortable clothing the morning of surgery Remember to brush your teeth WITH YOUR REGULAR TOOTHPASTE.   Please read over the following fact sheets that you were given.

## 2020-08-18 ENCOUNTER — Other Ambulatory Visit: Payer: Self-pay

## 2020-08-18 ENCOUNTER — Encounter (HOSPITAL_COMMUNITY)
Admission: RE | Admit: 2020-08-18 | Discharge: 2020-08-18 | Disposition: A | Discharge status: Home / Self Care | Payer: Medicare HMO | Source: Ambulatory Visit | Attending: Neurosurgery | Admitting: Neurosurgery

## 2020-08-18 ENCOUNTER — Encounter (HOSPITAL_COMMUNITY): Payer: Self-pay

## 2020-08-18 DIAGNOSIS — Z01812 Encounter for preprocedural laboratory examination: Secondary | ICD-10-CM | POA: Diagnosis present

## 2020-08-18 HISTORY — DX: Gastro-esophageal reflux disease without esophagitis: K21.9

## 2020-08-18 LAB — TYPE AND SCREEN
ABO/RH(D): A NEG
Antibody Screen: NEGATIVE

## 2020-08-18 LAB — CBC
HCT: 43.2 % (ref 39.0–52.0)
Hemoglobin: 14.4 g/dL (ref 13.0–17.0)
MCH: 32.1 pg (ref 26.0–34.0)
MCHC: 33.3 g/dL (ref 30.0–36.0)
MCV: 96.4 fL (ref 80.0–100.0)
Platelets: 240 10*3/uL (ref 150–400)
RBC: 4.48 MIL/uL (ref 4.22–5.81)
RDW: 12.9 % (ref 11.5–15.5)
WBC: 9.1 10*3/uL (ref 4.0–10.5)
nRBC: 0 % (ref 0.0–0.2)

## 2020-08-18 LAB — SURGICAL PCR SCREEN
MRSA, PCR: NEGATIVE
Staphylococcus aureus: NEGATIVE

## 2020-08-18 NOTE — Progress Notes (Signed)
PCP: Emily Filbert, MD Cardiologist: denies  EKG: na CXR: na ECHO: denies Stress Test: denies Cardiac Cath: denies  Fasting Blood Sugar- na Checks Blood Sugar_na__ times a day  OSA/CPAP: No  ASA/Blood Thinner: No  Covid test 08/23/20  Anesthesia Review: No  Patient denies shortness of breath, fever, cough, and chest pain at PAT appointment.  Patient verbalized understanding of instructions provided today at the PAT appointment.  Patient asked to review instructions at home and day of surgery.

## 2020-08-23 ENCOUNTER — Other Ambulatory Visit
Admission: RE | Admit: 2020-08-23 | Discharge: 2020-08-23 | Disposition: A | Discharge status: Home / Self Care | Payer: Medicare HMO | Source: Ambulatory Visit | Attending: Neurosurgery | Admitting: Neurosurgery

## 2020-08-23 ENCOUNTER — Other Ambulatory Visit: Payer: Self-pay

## 2020-08-23 ENCOUNTER — Other Ambulatory Visit: Payer: Self-pay | Admitting: Neurosurgery

## 2020-08-23 DIAGNOSIS — Z01812 Encounter for preprocedural laboratory examination: Secondary | ICD-10-CM | POA: Diagnosis present

## 2020-08-23 DIAGNOSIS — Z20822 Contact with and (suspected) exposure to covid-19: Secondary | ICD-10-CM | POA: Diagnosis not present

## 2020-08-24 ENCOUNTER — Other Ambulatory Visit: Payer: Self-pay

## 2020-08-24 ENCOUNTER — Inpatient Hospital Stay (HOSPITAL_COMMUNITY): Payer: Medicare HMO | Admitting: Certified Registered"

## 2020-08-24 ENCOUNTER — Inpatient Hospital Stay (HOSPITAL_COMMUNITY): Payer: Medicare HMO

## 2020-08-24 ENCOUNTER — Encounter (HOSPITAL_COMMUNITY): Payer: Self-pay | Admitting: Neurosurgery

## 2020-08-24 ENCOUNTER — Encounter (HOSPITAL_COMMUNITY)
Admission: RE | Disposition: A | Discharge status: Home / Self Care | Payer: Self-pay | Source: Home / Self Care | Attending: Neurosurgery

## 2020-08-24 ENCOUNTER — Inpatient Hospital Stay (HOSPITAL_COMMUNITY)
Admission: RE | Admit: 2020-08-24 | Discharge: 2020-08-25 | DRG: 455 | Disposition: A | Discharge status: Home / Self Care | Payer: Medicare HMO | Attending: Neurosurgery | Admitting: Neurosurgery

## 2020-08-24 DIAGNOSIS — M1990 Unspecified osteoarthritis, unspecified site: Secondary | ICD-10-CM | POA: Diagnosis present

## 2020-08-24 DIAGNOSIS — Z7952 Long term (current) use of systemic steroids: Secondary | ICD-10-CM

## 2020-08-24 DIAGNOSIS — E785 Hyperlipidemia, unspecified: Secondary | ICD-10-CM | POA: Diagnosis not present

## 2020-08-24 DIAGNOSIS — M4316 Spondylolisthesis, lumbar region: Secondary | ICD-10-CM | POA: Diagnosis not present

## 2020-08-24 DIAGNOSIS — Z419 Encounter for procedure for purposes other than remedying health state, unspecified: Secondary | ICD-10-CM

## 2020-08-24 DIAGNOSIS — K219 Gastro-esophageal reflux disease without esophagitis: Secondary | ICD-10-CM | POA: Diagnosis not present

## 2020-08-24 DIAGNOSIS — Z91018 Allergy to other foods: Secondary | ICD-10-CM

## 2020-08-24 DIAGNOSIS — Z79899 Other long term (current) drug therapy: Secondary | ICD-10-CM | POA: Diagnosis not present

## 2020-08-24 DIAGNOSIS — L409 Psoriasis, unspecified: Secondary | ICD-10-CM | POA: Diagnosis present

## 2020-08-24 DIAGNOSIS — M5116 Intervertebral disc disorders with radiculopathy, lumbar region: Secondary | ICD-10-CM | POA: Diagnosis present

## 2020-08-24 DIAGNOSIS — M48062 Spinal stenosis, lumbar region with neurogenic claudication: Principal | ICD-10-CM | POA: Diagnosis present

## 2020-08-24 DIAGNOSIS — Z888 Allergy status to other drugs, medicaments and biological substances status: Secondary | ICD-10-CM

## 2020-08-24 DIAGNOSIS — Z91013 Allergy to seafood: Secondary | ICD-10-CM | POA: Diagnosis not present

## 2020-08-24 LAB — SARS CORONAVIRUS 2 (TAT 6-24 HRS): SARS Coronavirus 2: NEGATIVE

## 2020-08-24 LAB — ABO/RH: ABO/RH(D): A NEG

## 2020-08-24 SURGERY — POSTERIOR LUMBAR FUSION 1 LEVEL
Anesthesia: General | Site: Spine Lumbar

## 2020-08-24 MED ORDER — ACETAMINOPHEN 500 MG PO TABS
1000.0000 mg | ORAL_TABLET | Freq: Four times a day (QID) | ORAL | Status: DC
  Administered 2020-08-24 (×2): 1000 mg via ORAL
  Filled 2020-08-24: qty 2

## 2020-08-24 MED ORDER — PRAVASTATIN SODIUM 10 MG PO TABS
20.0000 mg | ORAL_TABLET | ORAL | Status: DC
  Administered 2020-08-24: 20 mg via ORAL
  Filled 2020-08-24: qty 2

## 2020-08-24 MED ORDER — LACTATED RINGERS IV SOLN: INTRAVENOUS | Status: DC

## 2020-08-24 MED ORDER — ACETAMINOPHEN 500 MG PO TABS
ORAL_TABLET | ORAL | Status: AC
  Filled 2020-08-24: qty 2

## 2020-08-24 MED ORDER — METHOCARBAMOL 500 MG PO TABS
ORAL_TABLET | ORAL | Status: AC
  Filled 2020-08-24: qty 1

## 2020-08-24 MED ORDER — DROPERIDOL 2.5 MG/ML IJ SOLN
0.6250 mg | Freq: Once | INTRAMUSCULAR | Status: DC | PRN
Start: 2020-08-24 — End: 2020-08-24

## 2020-08-24 MED ORDER — BUPIVACAINE-EPINEPHRINE (PF) 0.5% -1:200000 IJ SOLN
INTRAMUSCULAR | Status: DC | PRN
  Administered 2020-08-24: 10 mL

## 2020-08-24 MED ORDER — HYDROMORPHONE HCL 1 MG/ML IJ SOLN
0.2500 mg | INTRAMUSCULAR | Status: DC | PRN
  Administered 2020-08-24 (×2): 0.5 mg via INTRAVENOUS

## 2020-08-24 MED ORDER — LIDOCAINE 2% (20 MG/ML) 5 ML SYRINGE: INTRAMUSCULAR | Status: DC | PRN

## 2020-08-24 MED ORDER — OXYCODONE HCL 5 MG PO TABS
ORAL_TABLET | ORAL | Status: AC
  Filled 2020-08-24: qty 1

## 2020-08-24 MED ORDER — MENTHOL 3 MG MT LOZG: 1.0000 | LOZENGE | OROMUCOSAL | Status: DC | PRN

## 2020-08-24 MED ORDER — SODIUM CHLORIDE 0.9% FLUSH: 3.0000 mL | Freq: Two times a day (BID) | INTRAVENOUS | Status: DC

## 2020-08-24 MED ORDER — BUPIVACAINE LIPOSOME 1.3 % IJ SUSP
INTRAMUSCULAR | Status: AC
  Filled 2020-08-24: qty 20

## 2020-08-24 MED ORDER — FENTANYL CITRATE (PF) 250 MCG/5ML IJ SOLN
INTRAMUSCULAR | Status: DC | PRN
  Administered 2020-08-24: 50 ug via INTRAVENOUS
  Administered 2020-08-24: 75 ug via INTRAVENOUS
  Administered 2020-08-24: 25 ug via INTRAVENOUS
  Administered 2020-08-24 (×2): 50 ug via INTRAVENOUS

## 2020-08-24 MED ORDER — ONDANSETRON HCL 4 MG/2ML IJ SOLN
INTRAMUSCULAR | Status: DC | PRN
  Administered 2020-08-24: 4 mg via INTRAVENOUS

## 2020-08-24 MED ORDER — LACTATED RINGERS IV SOLN: INTRAVENOUS | Status: DC | PRN

## 2020-08-24 MED ORDER — LIDOCAINE 2% (20 MG/ML) 5 ML SYRINGE
INTRAMUSCULAR | Status: DC | PRN
  Administered 2020-08-24: 80 mg via INTRAVENOUS

## 2020-08-24 MED ORDER — OXYCODONE HCL 5 MG PO TABS
5.0000 mg | ORAL_TABLET | Freq: Once | ORAL | Status: AC | PRN
  Administered 2020-08-24: 5 mg via ORAL

## 2020-08-24 MED ORDER — METHOCARBAMOL 500 MG PO TABS
500.0000 mg | ORAL_TABLET | Freq: Four times a day (QID) | ORAL | Status: DC | PRN
  Administered 2020-08-24 – 2020-08-25 (×3): 500 mg via ORAL
  Filled 2020-08-24 (×2): qty 1

## 2020-08-24 MED ORDER — PROMETHAZINE HCL 25 MG/ML IJ SOLN: 6.2500 mg | INTRAMUSCULAR | Status: DC | PRN

## 2020-08-24 MED ORDER — MORPHINE SULFATE (PF) 4 MG/ML IV SOLN: 4.0000 mg | INTRAVENOUS | Status: DC | PRN

## 2020-08-24 MED ORDER — ACETAMINOPHEN 500 MG PO TABS
1000.0000 mg | ORAL_TABLET | Freq: Once | ORAL | Status: AC
  Administered 2020-08-24: 1000 mg via ORAL

## 2020-08-24 MED ORDER — METHYLPREDNISOLONE SODIUM SUCC 125 MG IJ SOLR
INTRAMUSCULAR | Status: AC
  Filled 2020-08-24: qty 2

## 2020-08-24 MED ORDER — PHENYLEPHRINE HCL-NACL 10-0.9 MG/250ML-% IV SOLN
INTRAVENOUS | Status: DC | PRN
  Administered 2020-08-24: 50 ug/min via INTRAVENOUS

## 2020-08-24 MED ORDER — OXYCODONE HCL 5 MG/5ML PO SOLN: 5.0000 mg | Freq: Once | ORAL | Status: AC | PRN

## 2020-08-24 MED ORDER — BACITRACIN ZINC 500 UNIT/GM EX OINT
TOPICAL_OINTMENT | CUTANEOUS | Status: AC
  Filled 2020-08-24: qty 28.35

## 2020-08-24 MED ORDER — BUPIVACAINE-EPINEPHRINE 0.5% -1:200000 IJ SOLN
INTRAMUSCULAR | Status: AC
  Filled 2020-08-24: qty 1

## 2020-08-24 MED ORDER — OXYCODONE HCL 5 MG PO TABS
5.0000 mg | ORAL_TABLET | ORAL | Status: DC | PRN
  Administered 2020-08-24: 5 mg via ORAL

## 2020-08-24 MED ORDER — POLYETHYLENE GLYCOL 3350 17 G PO PACK: 17.0000 g | PACK | Freq: Every day | ORAL | Status: DC | PRN

## 2020-08-24 MED ORDER — FENTANYL CITRATE (PF) 250 MCG/5ML IJ SOLN
INTRAMUSCULAR | Status: AC
  Filled 2020-08-24: qty 5

## 2020-08-24 MED ORDER — PROPOFOL 10 MG/ML IV BOLUS
INTRAVENOUS | Status: DC | PRN
  Administered 2020-08-24: 150 mg via INTRAVENOUS

## 2020-08-24 MED ORDER — THROMBIN 5000 UNITS EX SOLR: OROMUCOSAL | Status: DC | PRN

## 2020-08-24 MED ORDER — MIDAZOLAM HCL 2 MG/2ML IJ SOLN
INTRAMUSCULAR | Status: DC | PRN
  Administered 2020-08-24: 2 mg via INTRAVENOUS

## 2020-08-24 MED ORDER — 0.9 % SODIUM CHLORIDE (POUR BTL) OPTIME
TOPICAL | Status: DC | PRN
  Administered 2020-08-24: 1000 mL

## 2020-08-24 MED ORDER — HYDROCODONE-ACETAMINOPHEN 5-325 MG PO TABS: 1.0000 | ORAL_TABLET | ORAL | Status: DC | PRN

## 2020-08-24 MED ORDER — ORAL CARE MOUTH RINSE: 15.0000 mL | Freq: Once | OROMUCOSAL | Status: AC

## 2020-08-24 MED ORDER — GABAPENTIN 100 MG PO CAPS
200.0000 mg | ORAL_CAPSULE | Freq: Two times a day (BID) | ORAL | Status: DC
  Administered 2020-08-24 – 2020-08-25 (×2): 200 mg via ORAL
  Filled 2020-08-24 (×2): qty 2

## 2020-08-24 MED ORDER — OXYCODONE HCL 5 MG PO TABS
10.0000 mg | ORAL_TABLET | ORAL | Status: DC | PRN
  Administered 2020-08-24 – 2020-08-25 (×3): 10 mg via ORAL
  Filled 2020-08-24 (×3): qty 2

## 2020-08-24 MED ORDER — ONDANSETRON HCL 4 MG PO TABS: 4.0000 mg | ORAL_TABLET | Freq: Four times a day (QID) | ORAL | Status: DC | PRN

## 2020-08-24 MED ORDER — ROCURONIUM BROMIDE 10 MG/ML (PF) SYRINGE
PREFILLED_SYRINGE | INTRAVENOUS | Status: DC | PRN
  Administered 2020-08-24: 80 mg via INTRAVENOUS
  Administered 2020-08-24: 10 mg via INTRAVENOUS

## 2020-08-24 MED ORDER — KETAMINE HCL 50 MG/5ML IJ SOSY
PREFILLED_SYRINGE | INTRAMUSCULAR | Status: AC
  Filled 2020-08-24: qty 5

## 2020-08-24 MED ORDER — PROPOFOL 10 MG/ML IV BOLUS
INTRAVENOUS | Status: AC
  Filled 2020-08-24: qty 20

## 2020-08-24 MED ORDER — THROMBIN 5000 UNITS EX SOLR
CUTANEOUS | Status: AC
  Filled 2020-08-24: qty 5000

## 2020-08-24 MED ORDER — ACETAMINOPHEN 650 MG RE SUPP: 650.0000 mg | RECTAL | Status: DC | PRN

## 2020-08-24 MED ORDER — ACETAMINOPHEN 325 MG PO TABS
650.0000 mg | ORAL_TABLET | ORAL | Status: DC | PRN
  Administered 2020-08-25: 650 mg via ORAL
  Filled 2020-08-24: qty 2

## 2020-08-24 MED ORDER — DOCUSATE SODIUM 100 MG PO CAPS
100.0000 mg | ORAL_CAPSULE | Freq: Two times a day (BID) | ORAL | Status: DC
  Administered 2020-08-24 – 2020-08-25 (×2): 100 mg via ORAL
  Filled 2020-08-24 (×2): qty 1

## 2020-08-24 MED ORDER — SODIUM CHLORIDE 0.9 % IV SOLN: 250.0000 mL | INTRAVENOUS | Status: DC

## 2020-08-24 MED ORDER — SUGAMMADEX SODIUM 200 MG/2ML IV SOLN
INTRAVENOUS | Status: DC | PRN
  Administered 2020-08-24: 180 mg via INTRAVENOUS

## 2020-08-24 MED ORDER — KETAMINE HCL 10 MG/ML IJ SOLN
INTRAMUSCULAR | Status: DC | PRN
  Administered 2020-08-24: 10 mg via INTRAVENOUS
  Administered 2020-08-24: 20 mg via INTRAVENOUS
  Administered 2020-08-24: 10 mg via INTRAVENOUS

## 2020-08-24 MED ORDER — DEXAMETHASONE SODIUM PHOSPHATE 10 MG/ML IJ SOLN
INTRAMUSCULAR | Status: DC | PRN
  Administered 2020-08-24: 10 mg via INTRAVENOUS

## 2020-08-24 MED ORDER — SODIUM CHLORIDE 0.9% FLUSH: 3.0000 mL | INTRAVENOUS | Status: DC | PRN

## 2020-08-24 MED ORDER — KETOROLAC TROMETHAMINE 15 MG/ML IJ SOLN
INTRAMUSCULAR | Status: DC | PRN
  Administered 2020-08-24: 15 mg via INTRAVENOUS

## 2020-08-24 MED ORDER — PANTOPRAZOLE SODIUM 40 MG PO TBEC
80.0000 mg | DELAYED_RELEASE_TABLET | Freq: Every day | ORAL | Status: DC
  Administered 2020-08-24 – 2020-08-25 (×2): 80 mg via ORAL
  Filled 2020-08-24 (×2): qty 2

## 2020-08-24 MED ORDER — BUPIVACAINE LIPOSOME 1.3 % IJ SUSP
INTRAMUSCULAR | Status: DC | PRN
  Administered 2020-08-24: 20 mL

## 2020-08-24 MED ORDER — CHLORHEXIDINE GLUCONATE 0.12 % MT SOLN
15.0000 mL | Freq: Once | OROMUCOSAL | Status: AC
  Administered 2020-08-24: 15 mL via OROMUCOSAL
  Filled 2020-08-24: qty 15

## 2020-08-24 MED ORDER — CEFAZOLIN SODIUM-DEXTROSE 2-4 GM/100ML-% IV SOLN
2.0000 g | Freq: Three times a day (TID) | INTRAVENOUS | Status: AC
  Administered 2020-08-24 – 2020-08-25 (×2): 2 g via INTRAVENOUS
  Filled 2020-08-24 (×2): qty 100

## 2020-08-24 MED ORDER — ONDANSETRON HCL 4 MG/2ML IJ SOLN: 4.0000 mg | Freq: Four times a day (QID) | INTRAMUSCULAR | Status: DC | PRN

## 2020-08-24 MED ORDER — PREDNISONE 10 MG PO TABS
10.0000 mg | ORAL_TABLET | Freq: Every day | ORAL | Status: DC
  Administered 2020-08-25: 10 mg via ORAL
  Filled 2020-08-24: qty 1

## 2020-08-24 MED ORDER — VITAMIN D 25 MCG (1000 UNIT) PO TABS
1000.0000 [IU] | ORAL_TABLET | Freq: Every day | ORAL | Status: DC
  Administered 2020-08-25: 1000 [IU] via ORAL
  Filled 2020-08-24 (×2): qty 1

## 2020-08-24 MED ORDER — COQ10 100 MG PO CAPS: 100.0000 mg | ORAL_CAPSULE | ORAL | Status: DC

## 2020-08-24 MED ORDER — PHENOL 1.4 % MT LIQD: 1.0000 | OROMUCOSAL | Status: DC | PRN

## 2020-08-24 MED ORDER — CHLORHEXIDINE GLUCONATE CLOTH 2 % EX PADS: 6.0000 | MEDICATED_PAD | Freq: Once | CUTANEOUS | Status: DC

## 2020-08-24 MED ORDER — CEFAZOLIN SODIUM-DEXTROSE 2-4 GM/100ML-% IV SOLN
2.0000 g | INTRAVENOUS | Status: AC
  Administered 2020-08-24: 2 g via INTRAVENOUS
  Filled 2020-08-24: qty 100

## 2020-08-24 MED ORDER — MIDAZOLAM HCL 2 MG/2ML IJ SOLN
INTRAMUSCULAR | Status: AC
  Filled 2020-08-24: qty 2

## 2020-08-24 MED ORDER — HYDROMORPHONE HCL 1 MG/ML IJ SOLN
INTRAMUSCULAR | Status: AC
  Filled 2020-08-24: qty 1

## 2020-08-24 MED ORDER — BISACODYL 10 MG RE SUPP: 10.0000 mg | Freq: Every day | RECTAL | Status: DC | PRN

## 2020-08-24 SURGICAL SUPPLY — 57 items
BAG COUNTER SPONGE SURGICOUNT (BAG) ×4 IMPLANT
BASKET BONE COLLECTION (BASKET) ×2 IMPLANT
BENZOIN TINCTURE PRP APPL 2/3 (GAUZE/BANDAGES/DRESSINGS) ×2 IMPLANT
BLADE CLIPPER SURG (BLADE) ×2 IMPLANT
BUR MATCHSTICK NEURO 3.0 LAGG (BURR) ×2 IMPLANT
BUR PRECISION FLUTE 6.0 (BURR) ×2 IMPLANT
CANISTER SUCT 3000ML PPV (MISCELLANEOUS) ×2 IMPLANT
CAP LOCK DLX THRD (Cap) ×8 IMPLANT
CARTRIDGE OIL MAESTRO DRILL (MISCELLANEOUS) ×1 IMPLANT
CNTNR URN SCR LID CUP LEK RST (MISCELLANEOUS) ×1 IMPLANT
CONT SPEC 4OZ STRL OR WHT (MISCELLANEOUS) ×1
COVER BACK TABLE 60X90IN (DRAPES) ×2 IMPLANT
DIFFUSER DRILL AIR PNEUMATIC (MISCELLANEOUS) ×2 IMPLANT
DRAPE C-ARM 42X72 X-RAY (DRAPES) ×4 IMPLANT
DRAPE HALF SHEET 40X57 (DRAPES) ×2 IMPLANT
DRAPE LAPAROTOMY 100X72X124 (DRAPES) ×2 IMPLANT
DRAPE SURG 17X23 STRL (DRAPES) ×8 IMPLANT
DRSG OPSITE POSTOP 4X6 (GAUZE/BANDAGES/DRESSINGS) ×2 IMPLANT
ELECT BLADE 4.0 EZ CLEAN MEGAD (MISCELLANEOUS) ×2
ELECT REM PT RETURN 9FT ADLT (ELECTROSURGICAL) ×2
ELECTRODE BLDE 4.0 EZ CLN MEGD (MISCELLANEOUS) ×1 IMPLANT
ELECTRODE REM PT RTRN 9FT ADLT (ELECTROSURGICAL) ×1 IMPLANT
EVACUATOR 1/8 PVC DRAIN (DRAIN) IMPLANT
GAUZE 4X4 16PLY ~~LOC~~+RFID DBL (SPONGE) ×2 IMPLANT
GLOVE EXAM NITRILE XL STR (GLOVE) IMPLANT
GLOVE SURG ENC MOIS LTX SZ8 (GLOVE) ×4 IMPLANT
GLOVE SURG ENC MOIS LTX SZ8.5 (GLOVE) ×4 IMPLANT
GOWN STRL REUS W/ TWL LRG LVL3 (GOWN DISPOSABLE) ×2 IMPLANT
GOWN STRL REUS W/ TWL XL LVL3 (GOWN DISPOSABLE) ×2 IMPLANT
GOWN STRL REUS W/TWL 2XL LVL3 (GOWN DISPOSABLE) ×2 IMPLANT
GOWN STRL REUS W/TWL LRG LVL3 (GOWN DISPOSABLE) ×2
GOWN STRL REUS W/TWL XL LVL3 (GOWN DISPOSABLE) ×2
HEMOSTAT POWDER KIT SURGIFOAM (HEMOSTASIS) ×2 IMPLANT
KIT BASIN OR (CUSTOM PROCEDURE TRAY) ×2 IMPLANT
KIT TURNOVER KIT B (KITS) ×2 IMPLANT
NEEDLE HYPO 21X1.5 SAFETY (NEEDLE) IMPLANT
NEEDLE HYPO 22GX1.5 SAFETY (NEEDLE) ×2 IMPLANT
NS IRRIG 1000ML POUR BTL (IV SOLUTION) ×2 IMPLANT
OIL CARTRIDGE MAESTRO DRILL (MISCELLANEOUS) ×2
PACK LAMINECTOMY NEURO (CUSTOM PROCEDURE TRAY) ×2 IMPLANT
PATTIES SURGICAL .5 X1 (DISPOSABLE) IMPLANT
PUTTY DBM 10CC CALC GRAN (Putty) ×2 IMPLANT
ROD CURVED TI 6.35X45 (Rod) ×4 IMPLANT
SCREW PA DLX CREO 7.5X55 (Screw) ×8 IMPLANT
SPACER ALTERA 10X31-15 (Spacer) ×2 IMPLANT
SPONGE NEURO XRAY DETECT 1X3 (DISPOSABLE) IMPLANT
SPONGE SURGIFOAM ABS GEL 100 (HEMOSTASIS) IMPLANT
SPONGE T-LAP 4X18 ~~LOC~~+RFID (SPONGE) ×4 IMPLANT
STRIP CLOSURE SKIN 1/2X4 (GAUZE/BANDAGES/DRESSINGS) ×2 IMPLANT
SUT VIC AB 1 CT1 18XBRD ANBCTR (SUTURE) ×2 IMPLANT
SUT VIC AB 1 CT1 8-18 (SUTURE) ×2
SUT VIC AB 2-0 CP2 18 (SUTURE) ×4 IMPLANT
SYR 20ML LL LF (SYRINGE) IMPLANT
TOWEL GREEN STERILE (TOWEL DISPOSABLE) ×2 IMPLANT
TOWEL GREEN STERILE FF (TOWEL DISPOSABLE) ×2 IMPLANT
TRAY FOLEY MTR SLVR 16FR STAT (SET/KITS/TRAYS/PACK) ×2 IMPLANT
WATER STERILE IRR 1000ML POUR (IV SOLUTION) ×2 IMPLANT

## 2020-08-24 NOTE — Op Note (Signed)
Brief history: The patient is a 65 year old white male who has complained of back and right greater than left leg pain consistent with neurogenic claudication.  He has failed medical management.  He was worked up with a lumbar MRI and lumbar x-rays which demonstrated an L4-5 spondylolisthesis and spinal stenosis.  I discussed the various treatment options with him.  He has decided proceed with surgery.  Preoperative diagnosis: L4-5 spondylolisthesis, degenerative disc disease, spinal stenosis compressing both the L4 and the L5 nerve roots; lumbago; lumbar radiculopathy; neurogenic claudication  Postoperative diagnosis: The same  Procedure: Bilateral L4-5 laminotomy/foraminotomies/medial facetectomy to decompress the bilateral L4 and L5 nerve roots(the work required to do this was in addition to the work required to do the posterior lumbar interbody fusion because of the patient's spinal stenosis, facet arthropathy. Etc. requiring a wide decompression of the nerve roots.);  L4-5 transforaminal lumbar interbody fusion with local morselized autograft bone and Zimmer DBM; insertion of interbody prosthesis at L4-5 (globus peek expandable interbody prosthesis); posterior nonsegmental instrumentation from L4 to L5 with globus titanium pedicle screws and rods; posterior lateral arthrodesis at L4-5 with local morselized autograft bone and Zimmer DBM.  Surgeon: Dr. Earle Gell  Asst.: Dr. Ashok Pall and Arnetha Massy, NP  Anesthesia: Gen. endotracheal  Estimated blood loss: 200 cc  Drains: None  Complications: None  Description of procedure: The patient was brought to the operating room by the anesthesia team. General endotracheal anesthesia was induced. The patient was turned to the prone position on the Wilson frame. The patient's lumbosacral region was then prepared with Betadine scrub and Betadine solution. Sterile drapes were applied.  I then injected the area to be incised with Marcaine with  epinephrine solution. I then used the scalpel to make a linear midline incision over the L4-5 interspace. I then used electrocautery to perform a bilateral subperiosteal dissection exposing the spinous process and lamina of L4 and L5. We then obtained intraoperative radiograph to confirm our location. We then inserted the Verstrac retractor to provide exposure.  I began the decompression by using the high speed drill to perform laminotomies at L4-5 bilaterally. We then used the Kerrison punches to widen the laminotomy and removed the ligamentum flavum at L4-5 bilaterally. We used the Kerrison punches to remove the medial facets at L4-5 bilaterally, we removed the right L4-5 facet.. We performed wide foraminotomies about the bilateral L4 and L5 nerve roots completing the decompression.  We now turned our attention to the posterior lumbar interbody fusion. I used a scalpel to incise the intervertebral disc at L4-5 bilaterally. I then performed a partial intervertebral discectomy at L4-5 bilaterally using the pituitary forceps. We prepared the vertebral endplates at I7-8 bilaterally for the fusion by removing the soft tissues with the curettes. We then used the trial spacers to pick the appropriate sized interbody prosthesis. We prefilled his prosthesis with a combination of local morselized autograft bone that we obtained during the decompression as well as Zimmer DBM. We inserted the prefilled prosthesis into the interspace at L4-5 from the right, we then turned and expanded the prosthesis. There was a good snug fit of the prosthesis in the interspace. We then filled and the remainder of the intervertebral disc space with local morselized autograft bone and Zimmer DBM. This completed the posterior lumbar interbody arthrodesis.  During the decompression and insertion of the prosthesis the assistant protected the thecal sac and nerve roots with the D'Errico retractor.  We now turned attention to the  instrumentation. Under fluoroscopic  guidance we cannulated the bilateral L4 and L5 pedicles with the bone probe. We then removed the bone probe. We then tapped the pedicle with a 6.5 millimeter tap. We then removed the tap. We probed inside the tapped pedicle with a ball probe to rule out cortical breaches. We then inserted a 7.5 x 55 millimeter pedicle screw into the L4 and L5 pedicles bilaterally under fluoroscopic guidance. We then palpated along the medial aspect of the pedicles to rule out cortical breaches. There were none. The nerve roots were not injured. We then connected the unilateral pedicle screws with a lordotic rod. We compressed the construct and secured the rod in place with the caps. We then tightened the caps appropriately. This completed the instrumentation from L4-5 bilaterally.  We now turned our attention to the posterior lateral arthrodesis at L4-5. We used the high-speed drill to decorticate the remainder of the facets, pars, transverse process at L4-5. We then applied a combination of local morselized autograft bone and Zimmer DBM over these decorticated posterior lateral structures. This completed the posterior lateral arthrodesis.  We then obtained hemostasis using bipolar electrocautery. We irrigated the wound out with bacitracin solution. We inspected the thecal sac and nerve roots and noted they were well decompressed. We then removed the retractor.  We injected Exparel . We reapproximated patient's thoracolumbar fascia with interrupted #1 Vicryl suture. We reapproximated patient's subcutaneous tissue with interrupted 2-0 Vicryl suture. The reapproximated patient's skin with Steri-Strips and benzoin. The wound was then coated with bacitracin ointment. A sterile dressing was applied. The drapes were removed. The patient was subsequently returned to the supine position where they were extubated by the anesthesia team. He was then transported to the post anesthesia care unit in stable  condition. All sponge instrument and needle counts were reportedly correct at the end of this case.

## 2020-08-24 NOTE — H&P (Signed)
Subjective: The patient is a 65 year old white male who is complained of back and leg pain consistent with neurogenic claudication.  He has failed medical management.  He was worked up with a lumbar MRI which demonstrated an L4-5 spondylolisthesis and spinal stenosis.  I discussed the various treatment options with him he has decided proceed with surgery.  Past Medical History:  Diagnosis Date   Arthritis    GERD (gastroesophageal reflux disease)    Hyperlipidemia    Psoriasis     Past Surgical History:  Procedure Laterality Date   APPENDECTOMY     LEG SURGERY Right    TONSILLECTOMY      Allergies  Allergen Reactions   Celebrex [Celecoxib] Itching   Kiwi Extract Itching   Meloxicam Swelling   Shellfish Allergy Hives and Itching    Social History   Tobacco Use   Smoking status: Never   Smokeless tobacco: Never  Substance Use Topics   Alcohol use: No    Family History  Problem Relation Age of Onset   Cancer Mother    Heart disease Father    Psoriasis Father    Prior to Admission medications   Medication Sig Start Date End Date Taking? Authorizing Provider  Adalimumab (HUMIRA) 40 MG/0.4ML PSKT Inject 40 mg into the skin every 14 (fourteen) days. For maintenance. 03/24/20  Yes Ralene Bathe, MD  Ascorbic Acid (VITAMIN C) 1000 MG tablet Take 1,000 mg by mouth daily.   Yes [provider]  cholecalciferol (VITAMIN D3) 25 MCG (1000 UNIT) tablet Take 1,000 Units by mouth daily.   Yes [provider]  Coenzyme Q10 (COQ10) 100 MG CAPS Take 100 mg by mouth every other day. Takes with Pravastatin   Yes [provider]  diclofenac (VOLTAREN) 75 MG EC tablet Take 75 mg by mouth 2 (two) times daily as needed for moderate pain. 04/20/20  Yes [provider]  gabapentin (NEURONTIN) 100 MG capsule Take 200 mg by mouth 2 (two) times daily.   Yes [provider]  HYDROcodone-acetaminophen (NORCO/VICODIN) 5-325 MG tablet Take 1 tablet by mouth  every 6 (six) hours as needed for severe pain. Patient taking differently: Take 1 tablet by mouth every 4 (four) hours as needed for severe pain. 07/19/20  Yes Bonnielee Haff, MD  methocarbamol (ROBAXIN) 500 MG tablet Take 1 tablet (500 mg total) by mouth every 6 (six) hours as needed for muscle spasms. 07/19/20  Yes Bonnielee Haff, MD  Multiple Vitamins-Minerals (MULTIVITAMIN WITH MINERALS) tablet Take 1 tablet by mouth daily.   Yes [provider]  naproxen sodium (ALEVE) 220 MG tablet Take 220 mg by mouth daily as needed (pain).   Yes [provider]  omeprazole (PRILOSEC) 40 MG capsule Take 40 mg by mouth daily. 09/01/19  Yes [provider]  polyethylene glycol (MIRALAX / GLYCOLAX) 17 g packet Take 17 g by mouth daily as needed for moderate constipation. 07/19/20  Yes Bonnielee Haff, MD  pravastatin (PRAVACHOL) 20 MG tablet Take 20 mg by mouth every other day. 09/01/19  Yes [provider]  predniSONE (DELTASONE) 10 MG tablet Take 10 mg by mouth daily with breakfast.   Yes [provider]  vitamin B-12 (CYANOCOBALAMIN) 1000 MCG tablet Take 1,000 mcg by mouth daily.   Yes [provider]  gabapentin (NEURONTIN) 600 MG tablet Take 0.5 tablets (300 mg total) by mouth 2 (two) times daily. Patient not taking: Reported on 08/12/2020 07/19/20 08/18/20  Bonnielee Haff, MD  mometasone (ELOCON) 0.1 %  cream Apply 1 application topically daily as needed (Rash). 04/28/20   Ralene Bathe, MD  mometasone (ELOCON) 0.1 % lotion Apply to scalp 5 nights a week Patient taking differently: Apply 1 application topically See admin instructions. Apply to scalp 5 nights a week as needed for psoriasis 04/28/20   Ralene Bathe, MD  predniSONE (DELTASONE) 20 MG tablet Take 3 tablets once daily for 3 days followed by 2 tablets once daily for 3 days followed by 1 tablet once daily for 3 days and then stop Patient not taking: Reported on 08/12/2020 07/19/20   Bonnielee Haff, MD  senna-docusate (SENOKOT-S) 8.6-50 MG tablet Take 2 tablets by mouth at bedtime. Patient not taking: Reported on 08/12/2020 07/19/20   Bonnielee Haff, MD     Review of Systems  Positive ROS: As above  All other systems have been reviewed and were otherwise negative with the exception of those mentioned in the HPI and as above.  Objective: Vital signs in last 24 hours: Temp:  [97.5 F (36.4 C)] 97.5 F (36.4 C) (07/06 1052) Pulse Rate:  [63] 63 (07/06 1052) Resp:  [18] 18 (07/06 1052) BP: (156-174)/(97-103) 156/97 (07/06 1116) SpO2:  [96 %] 96 % (07/06 1052) Weight:  [89.9 kg] 89.9 kg (07/06 1052) Estimated body mass index is 26.87 kg/m as calculated from the following:   Height as of this encounter: 6' (1.829 m).   Weight as of this encounter: 89.9 kg.   General Appearance: Alert Head: Normocephalic, without obvious abnormality, atraumatic Eyes: PERRL, conjunctiva/corneas clear, EOM's intact,    Ears: Normal  Throat: Normal  Neck: Supple, Back: unremarkable Lungs: Clear to auscultation bilaterally, respirations unlabored Heart: Regular rate and rhythm, no murmur, rub or gallop Abdomen: Soft, non-tender Extremities: Extremities normal, atraumatic, no cyanosis or edema Skin: unremarkable  NEUROLOGIC:   Mental status: alert and oriented,Motor Exam - grossly normal Sensory Exam - grossly normal Reflexes:  Coordination - grossly normal Gait - grossly normal Balance - grossly normal Cranial Nerves: I: smell Not tested  II: visual acuity  OS: Normal  OD: Normal   II: visual fields Full to confrontation  II: pupils Equal, round, reactive to light  III,VII: ptosis None  III,IV,VI: extraocular muscles  Full ROM  V: mastication Normal  V: facial light touch sensation  Normal  V,VII: corneal reflex  Present  VII: facial muscle function - upper  Normal  VII: facial muscle function - lower Normal  VIII: hearing Not tested  IX: soft palate elevation  Normal   IX,X: gag reflex Present  XI: trapezius strength  5/5  XI: sternocleidomastoid strength 5/5  XI: neck flexion strength  5/5  XII: tongue strength  Normal    Data Review Lab Results  Component Value Date   WBC 9.1 08/18/2020   HGB 14.4 08/18/2020   HCT 43.2 08/18/2020   MCV 96.4 08/18/2020   PLT 240 08/18/2020   Lab Results  Component Value Date   NA 137 07/17/2020   K 4.2 07/17/2020   CL 102 07/17/2020   CO2 25 07/17/2020   BUN 22 07/17/2020   CREATININE 1.01 07/17/2020   GLUCOSE 123 (H) 07/17/2020   No results found for: INR, PROTIME  Assessment/Plan: L4-5 spinal listhesis, spinal stenosis, facet arthropathy, lumbago, lumbar radiculopathy, neurogenic claudication: I have discussed the situation with the patient.  I reviewed his imaging studies with him and pointed out the abnormalities.  We have discussed the various treatment options including surgery.  I have described the  surgical treatment option of an L4-5 decompression, instrumentation and fusion.  I have shown him surgical models.  I have given him a surgical pamphlet.  We have discussed the risk, benefits, alternatives, expected postoperative course, and likelihood of achieving our goals with surgery.  I have answered all his questions.  He has decided proceed with surgery.   Ophelia Charter 08/24/2020 12:33 PM

## 2020-08-24 NOTE — Anesthesia Procedure Notes (Signed)
Procedure Name: Intubation Date/Time: 08/24/2020 1:32 PM Performed by: Reeves Dam, CRNA Pre-anesthesia Checklist: Patient identified, Patient being monitored, Timeout performed, Emergency Drugs available and Suction available Patient Re-evaluated:Patient Re-evaluated prior to induction Oxygen Delivery Method: Circle system utilized Preoxygenation: Pre-oxygenation with 100% oxygen Induction Type: IV induction Ventilation: Mask ventilation without difficulty Laryngoscope Size: McGraph and 4 Grade View: Grade I Tube type: Oral Tube size: 7.5 mm Number of attempts: 1 Airway Equipment and Method: Stylet Placement Confirmation: ETT inserted through vocal cords under direct vision, positive ETCO2 and breath sounds checked- equal and bilateral Secured at: 24 cm Tube secured with: Tape Dental Injury: Teeth and Oropharynx as per pre-operative assessment

## 2020-08-24 NOTE — Anesthesia Preprocedure Evaluation (Addendum)
Anesthesia Evaluation  Patient identified by MRN, date of birth, ID band Patient awake    Reviewed: Allergy & Precautions, NPO status , Patient's Chart, lab work & pertinent test results  Airway Mallampati: II  TM Distance: >3 FB Neck ROM: Full    Dental no notable dental hx.    Pulmonary neg pulmonary ROS,    Pulmonary exam normal breath sounds clear to auscultation       Cardiovascular Hypertension: BP elevated today; no formal diagnosis of HTN. negative cardio ROS Normal cardiovascular exam Rhythm:Regular Rate:Normal     Neuro/Psych negative neurological ROS  negative psych ROS   GI/Hepatic Neg liver ROS, GERD  ,  Endo/Other  negative endocrine ROShyperlipidemia  Renal/GU negative Renal ROS  negative genitourinary   Musculoskeletal  (+) Arthritis ,   Abdominal Normal abdominal exam  (+)   Peds negative pediatric ROS (+)  Hematology negative hematology ROS (+)   Anesthesia Other Findings   Reproductive/Obstetrics negative OB ROS                            Anesthesia Physical Anesthesia Plan  ASA: 2  Anesthesia Plan: General   Post-op Pain Management:    Induction: Intravenous  PONV Risk Score and Plan: 2 and Treatment may vary due to age or medical condition, Dexamethasone, Midazolam and Ondansetron  Airway Management Planned: Oral ETT  Additional Equipment: None  Intra-op Plan:   Post-operative Plan: Extubation in OR  Informed Consent: I have reviewed the patients History and Physical, chart, labs and discussed the procedure including the risks, benefits and alternatives for the proposed anesthesia with the patient or authorized representative who has indicated his/her understanding and acceptance.     Dental advisory given  Plan Discussed with: CRNA and Anesthesiologist  Anesthesia Plan Comments: (GETA. 2 PIV. Norton Blizzard, MD  )       Anesthesia Quick  Evaluation

## 2020-08-24 NOTE — Progress Notes (Signed)
Orthopedic Tech Progress Note Patient Details:  [name] [birth year] [id number]  PACU RN stated " patient has BRACE"  Patient ID: Larry Russell, male   DOB: 1955/11/05, 65 y.o.   MRN: [id number]  Janit Pagan 08/24/2020, 5:29 PM

## 2020-08-24 NOTE — Transfer of Care (Signed)
Immediate Anesthesia Transfer of Care Note  Patient: Larry Russell  Procedure(s) Performed: LUMBAR FOUR-FIVE POSTERIOR LUMBAR INTERBODY FUSION (Spine Lumbar)  Patient Location: PACU  Anesthesia Type:General  Level of Consciousness: awake, drowsy, patient cooperative and responds to stimulation  Airway & Oxygen Therapy: Patient Spontanous Breathing and Patient connected to face mask oxygen  Post-op Assessment: Report given to RN and Post -op Vital signs reviewed and stable  Post vital signs: Reviewed and stable  Last Vitals:  Vitals Value Taken Time  BP 132/86 08/24/20 1645  Temp    Pulse 80 08/24/20 1649  Resp 20 08/24/20 1649  SpO2 99 % 08/24/20 1649  Vitals shown include unvalidated device data.  Last Pain:  Vitals:   08/24/20 1107  TempSrc:   PainSc: 3          Complications: No notable events documented.

## 2020-08-24 NOTE — Progress Notes (Signed)
PHARMACIST - PHYSICIAN ORDER COMMUNICATION  CONCERNING: P&T Medication Policy on Herbal Medications  DESCRIPTION:  This patient's order for:  Co-enzyme Q10 has been noted.  This product(s) is classified as an "herbal" or natural product. Due to a lack of definitive safety studies or FDA approval, nonstandard manufacturing practices, plus the potential risk of unknown drug-drug interactions while on inpatient medications, the Pharmacy and Therapeutics Committee does not permit the use of "herbal" or natural products of this type within [facility].   ACTION TAKEN: The pharmacy department is unable to verify this order at this time and your patient has been informed of this safety policy. Please reevaluate patient's clinical condition at discharge and address if the herbal or natural product(s) should be resumed at that time.   

## 2020-08-24 NOTE — Anesthesia Postprocedure Evaluation (Signed)
Anesthesia Post Note  Patient: Larry Russell  Procedure(s) Performed: LUMBAR FOUR-FIVE POSTERIOR LUMBAR INTERBODY FUSION (Spine Lumbar)     Patient location during evaluation: PACU Anesthesia Type: General Level of consciousness: awake and alert, patient cooperative and oriented Pain management: pain level controlled Vital Signs Assessment: post-procedure vital signs reviewed and stable Respiratory status: spontaneous breathing, nonlabored ventilation and respiratory function stable Cardiovascular status: blood pressure returned to baseline and stable Postop Assessment: no apparent nausea or vomiting Anesthetic complications: no   No notable events documented.  Last Vitals:  Vitals:   08/24/20 1730 08/24/20 1745  BP: (!) 132/94 (!) 136/97  Pulse: 89 84  Resp: 12 15  Temp:    SpO2: 94% 95%    Last Pain:  Vitals:   08/24/20 1745  TempSrc:   PainSc: Asleep                 Abdalrahman Clementson,E. [place]

## 2020-08-25 LAB — BASIC METABOLIC PANEL
Anion gap: 9 (ref 5–15)
BUN: 13 mg/dL (ref 8–23)
CO2: 22 mmol/L (ref 22–32)
Calcium: 8.7 mg/dL — ABNORMAL LOW (ref 8.9–10.3)
Chloride: 101 mmol/L (ref 98–111)
Creatinine, Ser: 1.04 mg/dL (ref 0.61–1.24)
GFR, Estimated: >60 mL/min (ref >60)
Glucose, Bld: 145 mg/dL — ABNORMAL HIGH (ref 70–99)
Potassium: 4.1 mmol/L (ref 3.5–5.1)
Sodium: 132 mmol/L — ABNORMAL LOW (ref 135–145)

## 2020-08-25 LAB — CBC
HCT: 36.8 % — ABNORMAL LOW (ref 39.0–52.0)
Hemoglobin: 12.8 g/dL — ABNORMAL LOW (ref 13.0–17.0)
MCH: 32.5 pg (ref 26.0–34.0)
MCHC: 34.8 g/dL (ref 30.0–36.0)
MCV: 93.4 fL (ref 80.0–100.0)
Platelets: 187 10*3/uL (ref 150–400)
RBC: 3.94 MIL/uL — ABNORMAL LOW (ref 4.22–5.81)
RDW: 12.7 % (ref 11.5–15.5)
WBC: 12.3 10*3/uL — ABNORMAL HIGH (ref 4.0–10.5)
nRBC: 0 % (ref 0.0–0.2)

## 2020-08-25 MED ORDER — OXYCODONE-ACETAMINOPHEN 5-325 MG PO TABS: 1.0000 | ORAL_TABLET | ORAL | 0 refills | Status: DC | PRN

## 2020-08-25 MED ORDER — OXYCODONE-ACETAMINOPHEN 5-325 MG PO TABS: 1.0000 | ORAL_TABLET | ORAL | Status: DC | PRN

## 2020-08-25 MED ORDER — METHOCARBAMOL 500 MG PO TABS: 500.0000 mg | ORAL_TABLET | Freq: Four times a day (QID) | ORAL | 0 refills | Status: DC | PRN

## 2020-08-25 NOTE — Discharge Summary (Signed)
Physician Discharge Summary  Patient ID: Larry Russell MRN: [id number] DOB/AGE: 1955/11/19 65 y.o.  Admit date: 08/24/2020 Discharge date: 08/25/2020  Admission Diagnoses: Lumbar spondylolisthesis, lumbar facet arthropathy, lumbar spinal stenosis, lumbago, lumbar radiculopathy, neurogenic claudication  Discharge Diagnoses: The same Active Problems:   Spondylolisthesis, lumbar region   Discharged Condition: good  Hospital Course: I performed an L4-5 decompression, instrumentation and fusion on the patient on 08/24/2020.  The surgery went well.  On postoperative day #1 the patient requested discharge home.  His leg pain was gone.  He was given written and oral discharge instructions.  All his questions were answered.  Consults: PT, OT, care management Significant Diagnostic Studies: None Treatments: L4-5 decompression, instrumentation and fusion. Discharge Exam: Blood pressure 123/78, pulse (!) 101, temperature 97.9 F (36.6 C), temperature source Oral, resp. rate 18, height 6' (1.829 m), weight 89.9 kg, SpO2 95 %. The patient is alert and pleasant.  He looks well.  His strength is normal.  Disposition: Home  Discharge Instructions     Call MD for:  difficulty breathing, headache or visual disturbances   Complete by: As directed    Call MD for:  extreme fatigue   Complete by: As directed    Call MD for:  hives   Complete by: As directed    Call MD for:  persistant dizziness or light-headedness   Complete by: As directed    Call MD for:  persistant nausea and vomiting   Complete by: As directed    Call MD for:  redness, tenderness, or signs of infection (pain, swelling, redness, odor or green/yellow discharge around incision site)   Complete by: As directed    Call MD for:  severe uncontrolled pain   Complete by: As directed    Call MD for:  temperature >100.4   Complete by: As directed    Diet - low sodium heart healthy   Complete by: As directed    Discharge instructions    Complete by: As directed    Call [phone] for a followup appointment. Take a stool softener while you are using pain medications.   Driving Restrictions   Complete by: As directed    Do not drive for 2 weeks.   Increase activity slowly   Complete by: As directed    Lifting restrictions   Complete by: As directed    Do not lift more than 5 pounds. No excessive bending or twisting.   May shower / Bathe   Complete by: As directed    Remove the dressing for 3 days after surgery.  You may shower, but leave the incision alone.   Remove dressing in 48 hours   Complete by: As directed       Allergies as of 08/25/2020       Reactions   Celebrex [celecoxib] Itching   Kiwi Extract Itching   Meloxicam Swelling   Shellfish Allergy Hives, Itching        Medication List     STOP taking these medications    diclofenac 75 MG EC tablet Commonly known as: VOLTAREN   HYDROcodone-acetaminophen 5-325 MG tablet Commonly known as: NORCO/VICODIN   naproxen sodium 220 MG tablet Commonly known as: ALEVE   predniSONE 10 MG tablet Commonly known as: DELTASONE   predniSONE 20 MG tablet Commonly known as: DELTASONE       TAKE these medications    cholecalciferol 25 MCG (1000 UNIT) tablet Commonly known as: VITAMIN D3 Take 1,000 Units by mouth daily.  CoQ10 100 MG Caps Take 100 mg by mouth every other day. Takes with Pravastatin   gabapentin 600 MG tablet Commonly known as: NEURONTIN Take 0.5 tablets (300 mg total) by mouth 2 (two) times daily. What changed: Another medication with the same name was removed. Continue taking this medication, and follow the directions you see here.   Humira 40 MG/0.4ML Pskt Generic drug: Adalimumab Inject 40 mg into the skin every 14 (fourteen) days. For maintenance.   methocarbamol 500 MG tablet Commonly known as: ROBAXIN Take 1 tablet (500 mg total) by mouth every 6 (six) hours as needed for muscle spasms.   mometasone 0.1 %  cream Commonly known as: ELOCON Apply 1 application topically daily as needed (Rash). What changed: Another medication with the same name was changed. Make sure you understand how and when to take each.   mometasone 0.1 % lotion Commonly known as: ELOCON Apply to scalp 5 nights a week What changed:  how much to take how to take this when to take this additional instructions   multivitamin with minerals tablet Take 1 tablet by mouth daily.   omeprazole 40 MG capsule Commonly known as: PRILOSEC Take 40 mg by mouth daily.   oxyCODONE-acetaminophen 5-325 MG tablet Commonly known as: PERCOCET/ROXICET Take 1-2 tablets by mouth every 4 (four) hours as needed for moderate pain.   polyethylene glycol 17 g packet Commonly known as: MIRALAX / GLYCOLAX Take 17 g by mouth daily as needed for moderate constipation.   pravastatin 20 MG tablet Commonly known as: PRAVACHOL Take 20 mg by mouth every other day.   senna-docusate 8.6-50 MG tablet Commonly known as: Senokot-S Take 2 tablets by mouth at bedtime.   vitamin B-12 1000 MCG tablet Commonly known as: CYANOCOBALAMIN Take 1,000 mcg by mouth daily.   vitamin C 1000 MG tablet Take 1,000 mg by mouth daily.         Signed: Ophelia Charter 08/25/2020, 8:15 AM

## 2020-08-25 NOTE — Evaluation (Signed)
Occupational Therapy Evaluation/Discharge Patient Details Name: Larry Russell MRN: [id number] DOB: 23-Jul-1955 Today's Date: 08/25/2020    History of Present Illness 65 y.o. male s/p L4-5 posterior lumbar interbody fusion. PMH significant for arthritis, GERD, hyperli(idemia, psoriasis, and R leg surgery.   Clinical Impression   PTA, pt lived with his wife and was independent with ADL/IADLs. Pt performing all ADLs with mod I at this time. Pt educated and demonstrating use of compensatory techniques for LB dressing, brace application,oral care, toileting, and shower transfer. Pt demonstrating and verbalizing understanding. All questions answered. Recommend discharge home with no follow up OT. Re-consult if change in status. OT to sign off.     Follow Up Recommendations  No OT follow up    Equipment Recommendations  Other (comment) (Pt asking for cane, PT notified.)    Recommendations for Other Services       Precautions / Restrictions Precautions Precautions: Back Precaution Booklet Issued: Yes (comment) Precaution Comments: all education reviewed, PT verbalizing and demonstrating unterstanding Required Braces or Orthoses: Spinal Brace Spinal Brace: Lumbar corset Restrictions Weight Bearing Restrictions: No      Mobility Bed Mobility Overal bed mobility: Modified Independent             General bed mobility comments: Pt recalling use of log roll technique from HHPT sessions prior to surgery.    Transfers Overall transfer level: Modified independent Equipment used: None             General transfer comment: Mod I for increased time.    Balance Overall balance assessment: Mild deficits observed, not formally tested                                         ADL either performed or assessed with clinical judgement   ADL Overall ADL's : Modified independent                                       General ADL Comments: Pt  educated and demonstrating use of compensatory techniques for LB dressing, brace application, shower transfer, oral care, and toileting.     Vision   Vision Assessment?: No apparent visual deficits     Perception Perception Perception Tested?: No Comments: no apprent difficulty with perception   Praxis Praxis Praxis tested?: Not tested Praxis-Other Comments: no apparent difficulty with motor planning    Pertinent Vitals/Pain Pain Assessment: Faces Faces Pain Scale: Hurts a little bit Pain Location: back Pain Descriptors / Indicators: Discomfort Pain Intervention(s): Monitored during session     Hand Dominance Right   Extremity/Trunk Assessment Upper Extremity Assessment Upper Extremity Assessment: Overall WFL for tasks assessed   Lower Extremity Assessment Lower Extremity Assessment: Defer to PT evaluation   Cervical / Trunk Assessment Cervical / Trunk Assessment: Other exceptions Cervical / Trunk Exceptions: s/p lumbar spinal surgery   Communication Communication Communication: No difficulties   Cognition Arousal/Alertness: Awake/alert Behavior During Therapy: WFL for tasks assessed/performed Overall Cognitive Status: Within Functional Limits for tasks assessed                                 General Comments: Pt pleasant and conversational throughout session.   General Comments  Pt verbalizing and demonstrating compensatory techniques for ADLs.  Exercises     Shoulder Instructions      Home Living Family/patient expects to be discharged to:: Private residence Living Arrangements: Spouse/significant other Available Help at Discharge: Family;Available PRN/intermittently Type of Home: House Home Access: Stairs to enter Entrance Stairs-Number of Steps: 3 STE Entrance Stairs-Rails: Right;Left Home Layout: One level     Bathroom Shower/Tub: Occupational psychologist: Standard     Home Equipment: Environmental consultant - 2 wheels;Shower seat           Prior Functioning/Environment Level of Independence: Independent                 OT Problem List: Decreased strength;Decreased activity tolerance;Decreased knowledge of precautions;Pain      OT Treatment/Interventions:      OT Goals(Current goals can be found in the care plan section) Acute Rehab OT Goals Patient Stated Goal: go home OT Goal Formulation: With patient  OT Frequency:     Barriers to D/C:            Co-evaluation              AM-PAC OT "6 Clicks" Daily Activity     Outcome Measure Help from another person eating meals?: None Help from another person taking care of personal grooming?: None Help from another person toileting, which includes using toliet, bedpan, or urinal?: None Help from another person bathing (including washing, rinsing, drying)?: None Help from another person to put on and taking off regular upper body clothing?: None Help from another person to put on and taking off regular lower body clothing?: None 6 Click Score: 24   End of Session Equipment Utilized During Treatment: Gait belt;Back brace Nurse Communication: Mobility status  Activity Tolerance: Patient tolerated treatment well Patient left: in bed;with call bell/phone within reach;Other (comment) (PT entering room)  OT Visit Diagnosis: Unsteadiness on feet (R26.81);Muscle weakness (generalized) (M62.81);Pain Pain - part of body:  (back)                Time: 1610-9604 OT Time Calculation (min): 19 min Charges:  OT General Charges $OT Visit: 1 Visit OT Evaluation $OT Eval Low Complexity: 1 Low  Shanda Howells, OTDS   Shanda Howells 08/25/2020, 8:19 AM

## 2020-08-25 NOTE — Evaluation (Signed)
Physical Therapy Evaluation Patient Details Name: Larry Russell MRN: [id number] DOB: [birth year] Today's Date: 08/25/2020   History of Present Illness  The pt is a 65 yo male presenting s/p L4-5 PLIF due to ongoing back and leg pain. PMH includes: HLD and arthritis.   Clinical Impression  Pt in bed upon arrival of PT, agreeable to evaluation at this time. Prior to admission the pt was independent with all mobility and ADLs, but reports recent reduction in activity and dependence on AD due to pain. The pt now presents with minor limitations in functional mobility, strength in RLE, and dynamic stability due to above dx, but is safe to return home with family support once medically cleared. The pt was able to complete hallway ambulation and stair training with supervision for safety, intermittent use of UE support. The pt did benefit from increased UE support with balance challenges, and will therefore benefit from cane for eventual return to outdoor ambulation. The pt was educated on progressive return to mobility, safe exercise progression, fall risk reduction, and spinal precautions. May benefit from 1-2 visits of PT or personal trainer visits pending progression to train proper form, technique, and core activation once pt cleared to return to full exercise.      Follow Up Recommendations No PT follow up;Supervision - Intermittent    Equipment Recommendations  Cane    Recommendations for Other Services       Precautions / Restrictions Precautions Precautions: Back Precaution Booklet Issued: Yes (comment) Precaution Comments: all education reviewed, PT verbalizing and demonstrating unterstanding Required Braces or Orthoses: Spinal Brace Spinal Brace: Lumbar corset;Applied in sitting position Restrictions Weight Bearing Restrictions: No      Mobility  Bed Mobility Overal bed mobility: Modified Independent             General bed mobility comments: pt sitting EOB upon arrival  of PT    Transfers Overall transfer level: Modified independent Equipment used: None             General transfer comment: supervision for safety, pt able to complete multiple times without evidence of instability  Ambulation/Gait Ambulation/Gait assistance: Supervision Gait Distance (Feet): 350 Feet Assistive device: None;Straight cane Gait Pattern/deviations: Step-through pattern;Decreased stride length Gait velocity: 0.8 m/s Gait velocity interpretation: 1.31 - 2.62 ft/sec, indicative of limited community ambulator General Gait Details: pt with generally stable gait, increased callenge with changes in environment such as incline/decline or high stepping to avoid obstacles. no overt LOB  Stairs Stairs: Yes Stairs assistance: Supervision Stair Management: One rail Right;Forwards;Alternating pattern Number of Stairs: 5 General stair comments: able to manage with step-to or alternating pattern, slight increase in pain with alternating at this time  Wheelchair Mobility    Modified Rankin (Stroke Patients Only)       Balance Overall balance assessment: Mild deficits observed, not formally tested                                           Pertinent Vitals/Pain Pain Assessment: 0-10 Pain Score: 3  Faces Pain Scale: Hurts a little bit Pain Location: back Pain Descriptors / Indicators: Discomfort Pain Intervention(s): Limited activity within patient's tolerance;Monitored during session;Repositioned    Home Living Family/patient expects to be discharged to:: Private residence Living Arrangements: Spouse/significant other Available Help at Discharge: Family;Available PRN/intermittently Type of Home: House Home Access: Stairs to enter Entrance Stairs-Rails: Psychiatric nurse  of Steps: 3 STE Home Layout: One level Home Equipment: [place] - 2 wheels;Shower seat      Prior Function Level of Independence: Independent                Hand Dominance   Dominant Hand: Right    Extremity/Trunk Assessment   Upper Extremity Assessment Upper Extremity Assessment: Overall WFL for tasks assessed    Lower Extremity Assessment Lower Extremity Assessment: RLE deficits/detail RLE Deficits / Details: grossly 4/5, slight limitation by pain, pt denies difference in sensation other than bottom of feet (neuropathy) RLE Sensation: history of peripheral neuropathy RLE Coordination: WNL    Cervical / Trunk Assessment Cervical / Trunk Assessment: Other exceptions Cervical / Trunk Exceptions: s/p lumbar spinal surgery  Communication   Communication: No difficulties  Cognition Arousal/Alertness: Awake/alert Behavior During Therapy: WFL for tasks assessed/performed Overall Cognitive Status: Within Functional Limits for tasks assessed                                 General Comments: pt with good safety awareness, able to answer all questions without issue      General Comments General comments (skin integrity, edema, etc.): pt educated in progressive walking program and potential need for 1-2 PT or personal traininer sessions once cleared to return to full mobility    Exercises     Assessment/Plan    PT Assessment Patent does not need any further PT services  PT Problem List         PT Treatment Interventions      PT Goals (Current goals can be found in the Care Plan section)  Acute Rehab PT Goals Patient Stated Goal: go home PT Goal Formulation: With patient Time For Goal Achievement: 09/08/20 Potential to Achieve Goals: Good     AM-PAC PT "6 Clicks" Mobility  Outcome Measure Help needed turning from your back to your side while in a flat bed without using bedrails?: None Help needed moving from lying on your back to sitting on the side of a flat bed without using bedrails?: None Help needed moving to and from a bed to a chair (including a wheelchair)?: None Help needed standing up from a  chair using your arms (e.g., wheelchair or bedside chair)?: None Help needed to walk in hospital room?: A Little Help needed climbing 3-5 steps with a railing? : A Little 6 Click Score: 22    End of Session Equipment Utilized During Treatment: Back brace Activity Tolerance: Patient tolerated treatment well Patient left: in bed (sitting EOB) Nurse Communication: Mobility status PT Visit Diagnosis: Other abnormalities of gait and mobility (R26.89)    Time: 8850-2774 PT Time Calculation (min) (ACUTE ONLY): 22 min   Charges:   PT Evaluation $PT Eval Low Complexity: 1 Low          Karma Ganja, PT, DPT   Acute Rehabilitation Department Pager #: [phone]  Otho Bellows 08/25/2020, 8:35 AM

## 2020-08-25 NOTE — Progress Notes (Signed)
Patient was transported via wheelchair by volunteer for discharge home; in no acute distress nor complaints of pain nor discomfort; his room was checked for all his belongings and everything was accounted for; discharge instructions given to patient and his wife by RN and both verbalized understanding on the instructions given.

## 2020-08-26 MED FILL — Heparin Sodium (Porcine) Inj 1000 Unit/ML: INTRAMUSCULAR | Qty: 30 | Status: AC

## 2020-08-26 MED FILL — Sodium Chloride IV Soln 0.9%: INTRAVENOUS | Qty: 1000 | Status: AC

## 2020-09-08 ENCOUNTER — Ambulatory Visit: Payer: Medicare HMO | Admitting: Dermatology

## 2020-11-24 ENCOUNTER — Ambulatory Visit: Payer: Medicare HMO | Admitting: Dermatology

## 2021-02-01 ENCOUNTER — Ambulatory Visit: Payer: Medicare HMO | Admitting: Dermatology

## 2021-02-01 ENCOUNTER — Other Ambulatory Visit: Payer: Self-pay

## 2021-02-01 DIAGNOSIS — L578 Other skin changes due to chronic exposure to nonionizing radiation: Secondary | ICD-10-CM

## 2021-02-01 DIAGNOSIS — D229 Melanocytic nevi, unspecified: Secondary | ICD-10-CM | POA: Diagnosis not present

## 2021-02-01 DIAGNOSIS — L409 Psoriasis, unspecified: Secondary | ICD-10-CM

## 2021-02-01 DIAGNOSIS — L4050 Arthropathic psoriasis, unspecified: Secondary | ICD-10-CM | POA: Diagnosis not present

## 2021-02-01 DIAGNOSIS — L814 Other melanin hyperpigmentation: Secondary | ICD-10-CM

## 2021-02-01 MED ORDER — VTAMA 1 % EX CREA
1.0000 | TOPICAL_CREAM | Freq: Every day | CUTANEOUS | 3 refills | Status: AC
Start: 2021-02-01 — End: ?

## 2021-02-01 NOTE — Patient Instructions (Addendum)
If You Need Anything After Your Visit ° °If you have any questions or concerns for your doctor, please call our main line at [phone] and press option 4 to reach your doctor's medical assistant. If no one answers, please leave a voicemail as directed and we will return your call as soon as possible. Messages left after 4 pm will be answered the following business day.  ° °You may also send us a message via MyChart. We typically respond to MyChart messages within 1-2 business days. ° °For prescription refills, please ask your pharmacy to contact our office. Our fax number is [phone]. ° °If you have an urgent issue when the clinic is closed that cannot wait until the next business day, you can page your doctor at the number below.   ° °Please note that while we do our best to be available for urgent issues outside of office hours, we are not available 24/7.  ° °If you have an urgent issue and are unable to reach us, you may choose to seek medical care at your doctor's office, retail clinic, urgent care center, or emergency room. ° °If you have a medical emergency, please immediately call 911 or go to the emergency department. ° °Pager Numbers ° °- Dr. Kowalski: [phone] ° °- Dr. Moye: [phone] ° °- Dr. Stewart: [phone] ° °In the event of inclement weather, please call our main line at [phone] for an update on the status of any delays or closures. ° °Dermatology Medication Tips: °Please keep the boxes that topical medications come in in order to help keep track of the instructions about where and how to use these. Pharmacies typically print the medication instructions only on the boxes and not directly on the medication tubes.  ° °If your medication is too expensive, please contact our office at [phone] option 4 or send us a message through MyChart.  ° °We are unable to tell what your co-pay for medications will be in advance as this is different depending on your insurance coverage.  However, we may be able to find a substitute medication at lower cost or fill out paperwork to get insurance to cover a needed medication.  ° °If a prior authorization is required to get your medication covered by your insurance company, please allow us 1-2 business days to complete this process. ° °Drug prices often vary depending on where the prescription is filled and some pharmacies may offer cheaper prices. ° °The website www.goodrx.com contains coupons for medications through different pharmacies. The prices here do not account for what the cost may be with help from insurance (it may be cheaper with your insurance), but the website can give you the price if you did not use any insurance.  °- You can print the associated coupon and take it with your prescription to the pharmacy.  °- You may also stop by our office during regular business hours and pick up a GoodRx coupon card.  °- If you need your prescription sent electronically to a different pharmacy, notify our office through [facility] MyChart or by phone at [phone] option 4. ° ° ° ° °Si Usted Necesita Algo Después de Su Visita ° °También puede enviarnos un mensaje a través de MyChart. Por lo general respondemos a los mensajes de MyChart en el transcurso de 1 a 2 días hábiles. ° °Para renovar recetas, por favor pida a su farmacia que se ponga en contacto con nuestra oficina. Nuestro número de fax es el [phone]. ° °Si tiene   un asunto urgente cuando la clnica est cerrada y que no puede esperar hasta el siguiente da hbil, puede llamar/localizar a su doctor(a) al nmero que aparece a continuacin.   Por favor, tenga en cuenta que aunque hacemos todo lo posible para estar disponibles para asuntos urgentes fuera del horario de [place], no estamos disponibles las 24 horas del da, los 7 das de la [place].   Si tiene un problema urgente y no puede comunicarse con nosotros, puede optar por buscar atencin mdica  en el consultorio de su  doctor(a), en una clnica privada, en un centro de atencin urgente o en una sala de emergencias.  Si tiene Engineering geologist, por favor llame inmediatamente al 911 o vaya a la sala de emergencias.  Nmeros de bper  - Dr. Nehemiah Massed: [phone]  - Dra. Moye: [phone]  - Dra. Nicole Kindred: [phone]  En caso de inclemencias del [place], por favor llame a Johnsie Kindred principal al [phone] para una actualizacin sobre el [place] de cualquier retraso o cierre.  Consejos para la medicacin en dermatologa: Por favor, guarde las cajas en las que vienen los medicamentos de uso tpico para ayudarle a seguir las instrucciones sobre dnde y cmo usarlos. Las farmacias generalmente imprimen las instrucciones del medicamento slo en las cajas y no directamente en los tubos del [place].   Si su medicamento es muy caro, por favor, pngase en contacto con Zigmund Daniel llamando al [phone] y presione la opcin 4 o envenos un mensaje a travs de Pharmacist, community.   No podemos decirle cul ser su copago por los medicamentos por adelantado ya que esto es diferente dependiendo de la cobertura de su seguro. Sin embargo, es posible que podamos encontrar un medicamento sustituto a Electrical engineer un formulario para que el seguro cubra el medicamento que se considera necesario.   Si se requiere una autorizacin previa para que su compaa de seguros [place] su medicamento, por favor permtanos de 1 a 2 das hbiles para completar este proceso.  Los precios de los medicamentos varan con frecuencia dependiendo del Environmental consultant de dnde se surte la receta y alguna farmacias pueden ofrecer precios ms baratos.  El sitio web www.goodrx.com tiene cupones para medicamentos de Airline pilot. Los precios aqu no tienen en cuenta lo que podra costar con la ayuda del seguro (puede ser ms barato con su seguro), pero el sitio web puede darle el precio si no utiliz Research scientist (physical sciences).  - Puede imprimir el cupn  correspondiente y llevarlo con su receta a la farmacia.  - Tambin puede pasar por nuestra oficina durante el horario de atencin regular y Charity fundraiser una tarjeta de cupones de GoodRx.  - Si necesita que su receta se enve electrnicamente a una farmacia diferente, informe a nuestra oficina a travs de MyChart de [place] o por telfono llamando al [phone] y presione la opcin 4.   Start Vtama cream once daily to areas of psoriasis on legs, body.

## 2021-02-01 NOTE — Progress Notes (Signed)
Follow-Up Visit   Subjective  Larry Russell is a 65 y.o. male who presents for the following: Psoriasis (Legs, >19m f/u, Humira sq injections qowk, clobetasol prn, Pt did see rheumatologist at [facility] rheumatology, and he said they told him it could be osteo or psoriatic, pt does have joint pain, and is getting cramps in calves, hx of MTX in past). Pt is better controlled with Psoriasis of skin and arthritis on Humira.  We tried to change to Donnetta Hail within past year, but cost of Donnetta Hail was prohibitive.  The following portions of the chart were reviewed this encounter and updated as appropriate:   Tobacco   Allergies   Meds   Problems   Med Hx   Surg Hx   Fam Hx      Review of Systems:  No other skin or systemic complaints except as noted in HPI or Assessment and Plan.  Objective  Well appearing patient in no apparent distress; mood and affect are within normal limits.  A focused examination was performed including legs, arms, face, scalp. Relevant physical exam findings are noted in the Assessment and Plan.  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  Psoriasis - severe on systemic biologic treatment injections.  Psoriasis is a chronic non-curable, but treatable genetic/hereditary disease that may have other systemic features affecting other organ systems such as joints (Psoriatic Arthritis).  It is linked with heart disease, inflammatory bowel disease, non-alcoholic fatty liver disease, and depression. Significant skin psoriasis and/or psoriatic arthritis may have significant symptoms and affects activities of daily activity and often benefits from systemic biologic injection treatments.  These biologic treatments have some potential side  effects including immunosuppression and require pre-treatment laboratory screening and periodic laboratory monitoring and periodic in person evaluation and monitoring by the attending dermatologist physician (long term medication management).   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 Posey Pronto in rheumatology.   I would like Dr. Posey Pronto to evaluate patient's persistent arthritis symptoms and determine whether he has persistent psoriatic arthritis symptoms or if it is all osteoarthritis.  Hopefully he will get to see Dr. Posey Pronto prior to switching to Cosentyx in case he has other recommendations for changing his biologic injection.  Topical steroids (such as triamcinolone, fluocinolone, fluocinonide, mometasone, clobetasol, halobetasol, betamethasone, hydrocortisone) can cause thinning and lightening of the skin if they are used for too long in the same area. Your physician has selected the right strength medicine for your problem and area affected on the body. Please use your medication only as directed by your physician to prevent side effects.    Tapinarof (VTAMA) 1 % CREA - bil legs, scalp Apply 1 application topically daily. Qd to aa psoriasis on body  Related Procedures Ambulatory referral to Rheumatology DG  Chest 2 View  Actinic Damage - chronic, secondary to cumulative UV radiation exposure/sun exposure over time - diffuse scaly erythematous macules with underlying dyspigmentation - Recommend daily broad spectrum sunscreen SPF 30+ to sun-exposed areas, reapply every 2  hours as needed.  - Recommend staying in the shade or wearing long sleeves, sun glasses (UVA+UVB protection) and wide brim hats (4-inch brim around the entire circumference of the hat). - Call for new or changing lesions.  Melanocytic Nevi - Tan-brown and/or pink-flesh-colored symmetric macules and papules - Benign appearing on exam today - Observation - Call clinic for new or changing moles - Recommend daily use of broad spectrum spf 30+ sunscreen to sun-exposed areas.   Lentigines - Scattered tan macules - Due to sun exposure - Benign-appering, observe - Recommend daily broad spectrum sunscreen SPF 30+ to sun-exposed areas, reapply every 2 hours as needed. - Call for any changes  Return in about 6 months (around 08/02/2021) for Psoriasis f/u.  I, Othelia Pulling, RMA, am acting as scribe for Sarina Ser, MD . Documentation: I have reviewed the above documentation for accuracy and completeness, and I agree with the above.  Sarina Ser, MD

## 2021-02-02 ENCOUNTER — Encounter: Payer: Self-pay | Admitting: Dermatology

## 2021-02-09 ENCOUNTER — Telehealth: Payer: Self-pay | Admitting: Gastroenterology

## 2021-02-14 ENCOUNTER — Telehealth: Payer: Self-pay

## 2021-02-14 NOTE — Telephone Encounter (Signed)
Scheduled patient to come in he requested dr Allen Norris he wants egd and colonoscopy

## 2021-02-15 ENCOUNTER — Ambulatory Visit
Admission: RE | Admit: 2021-02-15 | Discharge: 2021-02-15 | Disposition: A | Discharge status: Home / Self Care | Payer: Medicare HMO | Attending: Dermatology | Admitting: Dermatology

## 2021-02-15 ENCOUNTER — Ambulatory Visit
Admission: RE | Admit: 2021-02-15 | Discharge: 2021-02-15 | Disposition: A | Discharge status: Home / Self Care | Payer: Medicare HMO | Source: Ambulatory Visit | Attending: Dermatology | Admitting: Dermatology

## 2021-02-15 DIAGNOSIS — L409 Psoriasis, unspecified: Secondary | ICD-10-CM | POA: Insufficient documentation

## 2021-02-21 ENCOUNTER — Encounter: Payer: Self-pay | Admitting: Gastroenterology

## 2021-02-21 ENCOUNTER — Other Ambulatory Visit: Payer: Self-pay

## 2021-02-21 ENCOUNTER — Telehealth: Payer: Self-pay

## 2021-02-21 ENCOUNTER — Ambulatory Visit (INDEPENDENT_AMBULATORY_CARE_PROVIDER_SITE_OTHER): Payer: No Typology Code available for payment source | Admitting: Gastroenterology

## 2021-02-21 VITALS — BP 145/92 | HR 87 | Temp 98.7°F | Ht 72.0 in | Wt 197.0 lb

## 2021-02-21 DIAGNOSIS — K22719 Barrett's esophagus with dysplasia, unspecified: Secondary | ICD-10-CM | POA: Diagnosis not present

## 2021-02-21 DIAGNOSIS — Z800 Family history of malignant neoplasm of digestive organs: Secondary | ICD-10-CM | POA: Diagnosis not present

## 2021-02-21 MED ORDER — NA SULFATE-K SULFATE-MG SULF 17.5-3.13-1.6 GM/177ML PO SOLN: 1.0000 | Freq: Once | ORAL | 0 refills | Status: AC

## 2021-02-21 MED ORDER — COSENTYX SENSOREADY (300 MG) 150 MG/ML ~~LOC~~ SOAJ: 300.0000 mg | SUBCUTANEOUS | 0 refills | Status: DC

## 2021-02-21 MED ORDER — COSENTYX SENSOREADY (300 MG) 150 MG/ML ~~LOC~~ SOAJ: 300.0000 mg | SUBCUTANEOUS | 6 refills | Status: DC

## 2021-02-21 NOTE — Addendum Note (Signed)
Addended by: Lurlean Nanny on: 02/21/2021 03:36 PM   Modules accepted: Orders

## 2021-02-21 NOTE — Progress Notes (Signed)
Gastroenterology Consultation  Referring Provider:     Rusty Aus, MD Primary Care Physician:  Rusty Aus, MD Primary Gastroenterologist:  Dr. Allen Norris     Reason for Consultation:     Family history of colon cancer and Barrett's esophagus        HPI:   Larry Russell is a 66 y.o. y/o male referred for consultation & management of family history of colon cancer Barrett's esophagus by Dr. Sabra Heck, Christean Grief, MD. 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  The patient had previously been seen by Dr. Vira Agar.  The patient was recommended back in February 2020 by Dr. Percell [place] office to have a repeat of his procedures and that was canceled due to the patient having the flu. The patient has no complaints at the present time.  He denies any abdominal pain nausea vomiting fevers chills black stools or bloody stools.  He also denies any change in bowel habits.  Past Medical History:  Diagnosis Date   Arthritis    GERD (gastroesophageal reflux disease)    Hyperlipidemia    Psoriasis     Past Surgical History:  Procedure Laterality Date   APPENDECTOMY     LEG SURGERY Right    TONSILLECTOMY      Prior to Admission medications   Medication Sig Start Date End Date Taking? Authorizing Provider  Adalimumab (HUMIRA) 40 MG/0.4ML PSKT Inject 40 mg into the skin every 14 (fourteen) days. For maintenance. 03/24/20   Ralene Bathe, MD  Ascorbic Acid (VITAMIN C) 1000 MG tablet Take 1,000 mg by mouth daily.    [provider]  cholecalciferol (VITAMIN D3) 25 MCG (1000 UNIT) tablet Take 1,000 Units by mouth daily.    [provider]  Coenzyme Q10 (COQ10) 100 MG CAPS Take 100 mg by mouth every other day. Takes with Pravastatin    [provider]  gabapentin (NEURONTIN) 600 MG tablet Take 0.5 tablets (300 mg total) by mouth 2 (two) times  daily. Patient not taking: Reported on 08/12/2020 07/19/20 08/18/20  Bonnielee Haff, MD  methocarbamol (ROBAXIN) 500 MG tablet Take 1 tablet (500 mg total) by mouth every 6 (six) hours as needed for muscle spasms. 08/25/20   Newman Pies, MD  mometasone (ELOCON) 0.1 % cream Apply 1 application topically daily as needed (Rash). 04/28/20   Ralene Bathe, MD  mometasone (ELOCON) 0.1 % lotion Apply to scalp 5 nights a week 04/28/20   Ralene Bathe, MD  Multiple Vitamins-Minerals (MULTIVITAMIN WITH MINERALS) tablet Take 1 tablet by mouth daily.    [provider]  omeprazole (PRILOSEC) 40 MG capsule Take 40 mg by mouth daily. 09/01/19   [provider]  oxyCODONE-acetaminophen (PERCOCET/ROXICET) 5-325 MG tablet Take 1-2 tablets by mouth every 4 (four) hours as needed for moderate pain. 08/25/20   Newman Pies, MD  polyethylene glycol (MIRALAX / GLYCOLAX) 17 g packet Take 17 g by mouth daily as needed for moderate constipation. 07/19/20   Bonnielee Haff, MD  pravastatin (PRAVACHOL) 20 MG tablet Take 20 mg by mouth every other day. 09/01/19   [provider]  senna-docusate (SENOKOT-S) 8.6-50 MG tablet Take 2 tablets by mouth at bedtime. 07/19/20   Bonnielee Haff, MD  Tapinarof (VTAMA) 1 % CREA Apply 1 application topically daily. Qd to aa psoriasis on body 02/01/21   Ralene Bathe, MD  vitamin B-12 (CYANOCOBALAMIN) 1000 MCG tablet Take 1,000 mcg by mouth daily.    [provider]    Family History  Problem Relation Age of Onset   Cancer Mother    Heart disease Father    Psoriasis Father      Social History   Tobacco Use   Smoking status: Never   Smokeless tobacco: Never  Vaping Use   Vaping Use: Never used  Substance Use Topics   Alcohol use: No   Drug use: No    Allergies as of 02/21/2021 - Review Complete 02/02/2021  Allergen Reaction Noted   Celebrex [celecoxib] Itching 01/30/2013   Kiwi extract Itching 09/04/2016   Meloxicam Swelling  07/14/2020   Shellfish allergy Hives and Itching 01/30/2013    Review of Systems:    All systems reviewed and negative except where noted in HPI.   Physical Exam:  There were no vitals taken for this visit. No LMP for male patient. General:   Alert,  Well-developed, well-nourished, pleasant and cooperative in NAD Head:  Normocephalic and atraumatic. Eyes:  Sclera clear, no icterus.   Conjunctiva pink. Ears:  Normal auditory acuity. Neck:  Supple; no masses or thyromegaly. Lungs:  Respirations even and unlabored.  Clear throughout to auscultation.   No wheezes, crackles, or rhonchi. No acute distress. Heart:  Regular rate and rhythm; no murmurs, clicks, rubs, or gallops. Abdomen:  Normal bowel sounds.  No bruits.  Soft, non-tender and non-distended without masses, hepatosplenomegaly or hernias noted.  No guarding or rebound tenderness.  Negative Carnett sign.   Rectal:  Deferred.  Pulses:  Normal pulses noted. Extremities:  No clubbing or edema.  No cyanosis. Neurologic:  Alert and oriented x3;  grossly normal neurologically. Skin:  Intact without significant lesions or rashes.  No jaundice. Lymph Nodes:  No significant cervical adenopathy. Psych:  Alert and cooperative. Normal mood and affect.  Imaging Studies: DG Chest 2 View  Result Date: 02/15/2021 CLINICAL DATA:  66 year old male with screening test for TB EXAM: CHEST - 2 VIEW COMPARISON:  None. FINDINGS: Cardiomediastinal silhouette unchanged in size and contour. No evidence of central vascular congestion. No interlobular septal thickening. Scarring at the apices of the lungs, similar to the prior. No pneumothorax or pleural effusion. Coarsened interstitial markings, with no confluent airspace disease. No acute displaced fracture. Degenerative changes of the spine. IMPRESSION: No active cardiopulmonary disease. Electronically Signed   By: Corrie Mckusick D.O.   On: 02/15/2021 10:06    Assessment and Plan:   Larry Russell is a  66 y.o. y/o male who comes in today with a history of Barrett's esophagus and a family history of colon cancer in his mother.  The patient has not had a repeat colonoscopy and EGD since 2012.  The patient will be set up for an EGD and colonoscopy.  The patient has been explained the plan agrees with it.    Lucilla Lame, MD. Marval Regal    Note: This dictation was prepared with Dragon dictation along with smaller phrase technology. Any transcriptional errors that result from this process are unintentional.

## 2021-02-21 NOTE — Progress Notes (Signed)
Cosentyx to [employer] [facility]

## 2021-02-21 NOTE — Telephone Encounter (Signed)
Advised pt of chest xray results.  Sent in Cosentyx to [facility]

## 2021-02-21 NOTE — H&P (View-Only) (Signed)
Gastroenterology Consultation  Referring Provider:     Rusty Aus, MD Primary Care Physician:  Rusty Aus, MD Primary Gastroenterologist:  Dr. Allen Norris     Reason for Consultation:     Family history of colon cancer and Barrett's esophagus        HPI:   Larry Russell is a 66 y.o. y/o male referred for consultation & management of family history of colon cancer Barrett's esophagus by Dr. Sabra Heck, Christean Grief, MD. 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  The patient had previously been seen by Dr. Vira Agar.  The patient was recommended back in February 2020 by Dr. Percell [place] office to have a repeat of his procedures and that was canceled due to the patient having the flu. The patient has no complaints at the present time.  He denies any abdominal pain nausea vomiting fevers chills black stools or bloody stools.  He also denies any change in bowel habits.  Past Medical History:  Diagnosis Date   Arthritis    GERD (gastroesophageal reflux disease)    Hyperlipidemia    Psoriasis     Past Surgical History:  Procedure Laterality Date   APPENDECTOMY     LEG SURGERY Right    TONSILLECTOMY      Prior to Admission medications   Medication Sig Start Date End Date Taking? Authorizing Provider  Adalimumab (HUMIRA) 40 MG/0.4ML PSKT Inject 40 mg into the skin every 14 (fourteen) days. For maintenance. 03/24/20   Ralene Bathe, MD  Ascorbic Acid (VITAMIN C) 1000 MG tablet Take 1,000 mg by mouth daily.    [provider]  cholecalciferol (VITAMIN D3) 25 MCG (1000 UNIT) tablet Take 1,000 Units by mouth daily.    [provider]  Coenzyme Q10 (COQ10) 100 MG CAPS Take 100 mg by mouth every other day. Takes with Pravastatin    [provider]  gabapentin (NEURONTIN) 600 MG tablet Take 0.5 tablets (300 mg total) by mouth 2 (two) times  daily. Patient not taking: Reported on 08/12/2020 07/19/20 08/18/20  Bonnielee Haff, MD  methocarbamol (ROBAXIN) 500 MG tablet Take 1 tablet (500 mg total) by mouth every 6 (six) hours as needed for muscle spasms. 08/25/20   Newman Pies, MD  mometasone (ELOCON) 0.1 % cream Apply 1 application topically daily as needed (Rash). 04/28/20   Ralene Bathe, MD  mometasone (ELOCON) 0.1 % lotion Apply to scalp 5 nights a week 04/28/20   Ralene Bathe, MD  Multiple Vitamins-Minerals (MULTIVITAMIN WITH MINERALS) tablet Take 1 tablet by mouth daily.    [provider]  omeprazole (PRILOSEC) 40 MG capsule Take 40 mg by mouth daily. 09/01/19   [provider]  oxyCODONE-acetaminophen (PERCOCET/ROXICET) 5-325 MG tablet Take 1-2 tablets by mouth every 4 (four) hours as needed for moderate pain. 08/25/20   Newman Pies, MD  polyethylene glycol (MIRALAX / GLYCOLAX) 17 g packet Take 17 g by mouth daily as needed for moderate constipation. 07/19/20   Bonnielee Haff, MD  pravastatin (PRAVACHOL) 20 MG tablet Take 20 mg by mouth every other day. 09/01/19   [provider]  senna-docusate (SENOKOT-S) 8.6-50 MG tablet Take 2 tablets by mouth at bedtime. 07/19/20   Bonnielee Haff, MD  Tapinarof (VTAMA) 1 % CREA Apply 1 application topically daily. Qd to aa psoriasis on body 02/01/21   Ralene Bathe, MD  vitamin B-12 (CYANOCOBALAMIN) 1000 MCG tablet Take 1,000 mcg by mouth daily.    [provider]    Family History  Problem Relation Age of Onset   Cancer Mother    Heart disease Father    Psoriasis Father      Social History   Tobacco Use   Smoking status: Never   Smokeless tobacco: Never  Vaping Use   Vaping Use: Never used  Substance Use Topics   Alcohol use: No   Drug use: No    Allergies as of 02/21/2021 - Review Complete 02/02/2021  Allergen Reaction Noted   Celebrex [celecoxib] Itching 01/30/2013   Kiwi extract Itching 09/04/2016   Meloxicam Swelling  07/14/2020   Shellfish allergy Hives and Itching 01/30/2013    Review of Systems:    All systems reviewed and negative except where noted in HPI.   Physical Exam:  There were no vitals taken for this visit. No LMP for male patient. General:   Alert,  Well-developed, well-nourished, pleasant and cooperative in NAD Head:  Normocephalic and atraumatic. Eyes:  Sclera clear, no icterus.   Conjunctiva pink. Ears:  Normal auditory acuity. Neck:  Supple; no masses or thyromegaly. Lungs:  Respirations even and unlabored.  Clear throughout to auscultation.   No wheezes, crackles, or rhonchi. No acute distress. Heart:  Regular rate and rhythm; no murmurs, clicks, rubs, or gallops. Abdomen:  Normal bowel sounds.  No bruits.  Soft, non-tender and non-distended without masses, hepatosplenomegaly or hernias noted.  No guarding or rebound tenderness.  Negative Carnett sign.   Rectal:  Deferred.  Pulses:  Normal pulses noted. Extremities:  No clubbing or edema.  No cyanosis. Neurologic:  Alert and oriented x3;  grossly normal neurologically. Skin:  Intact without significant lesions or rashes.  No jaundice. Lymph Nodes:  No significant cervical adenopathy. Psych:  Alert and cooperative. Normal mood and affect.  Imaging Studies: DG Chest 2 View  Result Date: 02/15/2021 CLINICAL DATA:  66 year old male with screening test for TB EXAM: CHEST - 2 VIEW COMPARISON:  None. FINDINGS: Cardiomediastinal silhouette unchanged in size and contour. No evidence of central vascular congestion. No interlobular septal thickening. Scarring at the apices of the lungs, similar to the prior. No pneumothorax or pleural effusion. Coarsened interstitial markings, with no confluent airspace disease. No acute displaced fracture. Degenerative changes of the spine. IMPRESSION: No active cardiopulmonary disease. Electronically Signed   By: Larry Russell D.O.   On: 02/15/2021 10:06    Assessment and Plan:   Larry Russell is a  66 y.o. y/o male who comes in today with a history of Barrett's esophagus and a family history of colon cancer in his mother.  The patient has not had a repeat colonoscopy and EGD since 2012.  The patient will be set up for an EGD and colonoscopy.  The patient has been explained the plan agrees with it.    Lucilla Lame, MD. Marval Regal    Note: This dictation was prepared with Dragon dictation along with smaller phrase technology. Any transcriptional errors that result from this process are unintentional.

## 2021-02-21 NOTE — Telephone Encounter (Signed)
-----   Message from Ralene Bathe, MD sent at 02/17/2021 12:56 PM EST ----- Chest X-ray from 02/15/2021  Showed no new lung changes.  No evidence of infectious process.  Send in NEW Rx for Cosentyx for psoriasis of skin and psoriatic arthritis.  Keep fu appt

## 2021-02-22 MED ORDER — PEG 3350-KCL-NABCB-NACL-NASULF 236 G PO SOLR: 4000.0000 mL | Freq: Once | ORAL | 0 refills | Status: AC

## 2021-02-22 NOTE — Addendum Note (Signed)
Addended by: Lurlean Nanny on: 02/22/2021 01:34 PM   Modules accepted: Orders

## 2021-02-23 ENCOUNTER — Encounter: Payer: Self-pay | Admitting: Gastroenterology

## 2021-02-23 ENCOUNTER — Other Ambulatory Visit: Payer: Self-pay

## 2021-02-23 NOTE — Telephone Encounter (Signed)
ERROR

## 2021-03-06 ENCOUNTER — Encounter
Admission: RE | Disposition: A | Discharge status: Home / Self Care | Payer: Self-pay | Source: Home / Self Care | Attending: Gastroenterology

## 2021-03-06 ENCOUNTER — Ambulatory Visit
Admission: RE | Admit: 2021-03-06 | Discharge: 2021-03-06 | Disposition: A | Discharge status: Home / Self Care | Payer: No Typology Code available for payment source | Attending: Gastroenterology | Admitting: Gastroenterology

## 2021-03-06 ENCOUNTER — Encounter: Payer: Self-pay | Admitting: Gastroenterology

## 2021-03-06 ENCOUNTER — Ambulatory Visit: Payer: No Typology Code available for payment source | Admitting: Anesthesiology

## 2021-03-06 ENCOUNTER — Other Ambulatory Visit: Payer: Self-pay

## 2021-03-06 DIAGNOSIS — Z79899 Other long term (current) drug therapy: Secondary | ICD-10-CM | POA: Diagnosis not present

## 2021-03-06 DIAGNOSIS — Z8371 Family history of colonic polyps: Secondary | ICD-10-CM | POA: Diagnosis not present

## 2021-03-06 DIAGNOSIS — K219 Gastro-esophageal reflux disease without esophagitis: Secondary | ICD-10-CM | POA: Diagnosis not present

## 2021-03-06 DIAGNOSIS — Z1211 Encounter for screening for malignant neoplasm of colon: Secondary | ICD-10-CM | POA: Diagnosis not present

## 2021-03-06 DIAGNOSIS — K641 Second degree hemorrhoids: Secondary | ICD-10-CM | POA: Diagnosis not present

## 2021-03-06 DIAGNOSIS — K22719 Barrett's esophagus with dysplasia, unspecified: Secondary | ICD-10-CM | POA: Diagnosis not present

## 2021-03-06 DIAGNOSIS — Z800 Family history of malignant neoplasm of digestive organs: Secondary | ICD-10-CM | POA: Diagnosis not present

## 2021-03-06 DIAGNOSIS — E785 Hyperlipidemia, unspecified: Secondary | ICD-10-CM | POA: Diagnosis not present

## 2021-03-06 DIAGNOSIS — K2270 Barrett's esophagus without dysplasia: Secondary | ICD-10-CM | POA: Insufficient documentation

## 2021-03-06 HISTORY — DX: Polyneuropathy, unspecified: G62.9

## 2021-03-06 HISTORY — DX: Contact with and (suspected) exposure to tuberculosis: Z20.1

## 2021-03-06 HISTORY — PX: ESOPHAGOGASTRODUODENOSCOPY (EGD) WITH PROPOFOL: SHX5813

## 2021-03-06 HISTORY — DX: Presence of spectacles and contact lenses: Z97.3

## 2021-03-06 HISTORY — PX: COLONOSCOPY WITH PROPOFOL: SHX5780

## 2021-03-06 SURGERY — COLONOSCOPY WITH PROPOFOL
Anesthesia: General | Site: Rectum

## 2021-03-06 MED ORDER — PROPOFOL 10 MG/ML IV BOLUS
INTRAVENOUS | Status: DC | PRN
  Administered 2021-03-06 (×2): 30 mg via INTRAVENOUS
  Administered 2021-03-06: 50 mg via INTRAVENOUS
  Administered 2021-03-06 (×3): 30 mg via INTRAVENOUS
  Administered 2021-03-06: 100 mg via INTRAVENOUS

## 2021-03-06 MED ORDER — STERILE WATER FOR IRRIGATION IR SOLN
Status: DC | PRN
  Administered 2021-03-06: 1

## 2021-03-06 MED ORDER — ACETAMINOPHEN 325 MG PO TABS: 325.0000 mg | ORAL_TABLET | Freq: Once | ORAL | Status: DC

## 2021-03-06 MED ORDER — LIDOCAINE HCL (CARDIAC) PF 100 MG/5ML IV SOSY
PREFILLED_SYRINGE | INTRAVENOUS | Status: DC | PRN
  Administered 2021-03-06: 30 mg via INTRAVENOUS

## 2021-03-06 MED ORDER — GLYCOPYRROLATE 0.2 MG/ML IJ SOLN
INTRAMUSCULAR | Status: DC | PRN
Start: 2021-03-06 — End: 2021-03-06
  Administered 2021-03-06: .1 mg via INTRAVENOUS

## 2021-03-06 MED ORDER — LACTATED RINGERS IV SOLN: INTRAVENOUS | Status: DC

## 2021-03-06 MED ORDER — SODIUM CHLORIDE 0.9 % IV SOLN: INTRAVENOUS | Status: DC

## 2021-03-06 MED ORDER — ACETAMINOPHEN 160 MG/5ML PO SOLN: 325.0000 mg | Freq: Once | ORAL | Status: DC

## 2021-03-06 SURGICAL SUPPLY — 38 items
BALLN DILATOR 10-12 8 (BALLOONS)
BALLN DILATOR 12-15 8 (BALLOONS)
BALLN DILATOR 15-18 8 (BALLOONS)
BALLN DILATOR CRE 0-12 8 (BALLOONS)
BALLN DILATOR ESOPH 8 10 CRE (MISCELLANEOUS) IMPLANT
BALLOON DILATOR 12-15 8 (BALLOONS) IMPLANT
BALLOON DILATOR 15-18 8 (BALLOONS) IMPLANT
BALLOON DILATOR CRE 0-12 8 (BALLOONS) IMPLANT
BLOCK BITE 60FR ADLT L/F GRN (MISCELLANEOUS) ×3 IMPLANT
CLIP HMST 235XBRD CATH ROT (MISCELLANEOUS) IMPLANT
CLIP RESOLUTION 360 11X235 (MISCELLANEOUS)
ELECT REM PT RETURN 9FT ADLT (ELECTROSURGICAL)
ELECTRODE REM PT RTRN 9FT ADLT (ELECTROSURGICAL) IMPLANT
FCP ESCP3.2XJMB 240X2.8X (MISCELLANEOUS)
FORCEPS BIOP RAD 4 LRG CAP 4 (CUTTING FORCEPS) ×1 IMPLANT
FORCEPS BIOP RJ4 240 W/NDL (MISCELLANEOUS)
FORCEPS ESCP3.2XJMB 240X2.8X (MISCELLANEOUS) IMPLANT
GOWN CVR UNV OPN BCK APRN NK (MISCELLANEOUS) ×8 IMPLANT
GOWN ISOL THUMB LOOP REG UNIV (MISCELLANEOUS) ×12
INJECTOR VARIJECT VIN23 (MISCELLANEOUS) IMPLANT
KIT DEFENDO VALVE AND CONN (KITS) IMPLANT
KIT PRC NS LF DISP ENDO (KITS) ×4 IMPLANT
KIT PROCEDURE OLYMPUS (KITS) ×6
MANIFOLD NEPTUNE II (INSTRUMENTS) ×6 IMPLANT
MARKER SPOT ENDO TATTOO 5ML (MISCELLANEOUS) IMPLANT
PROBE APC STR FIRE (PROBE) IMPLANT
RETRIEVER NET PLAT FOOD (MISCELLANEOUS) IMPLANT
RETRIEVER NET ROTH 2.5X230 LF (MISCELLANEOUS) IMPLANT
SNARE COLD EXACTO (MISCELLANEOUS) IMPLANT
SNARE SHORT THROW 13M SML OVAL (MISCELLANEOUS) IMPLANT
SNARE SHORT THROW 30M LRG OVAL (MISCELLANEOUS) IMPLANT
SNARE SNG USE RND 15MM (INSTRUMENTS) IMPLANT
SPOT EX ENDOSCOPIC TATTOO (MISCELLANEOUS)
SYR INFLATION 60ML (SYRINGE) IMPLANT
TRAP ETRAP POLY (MISCELLANEOUS) IMPLANT
VARIJECT INJECTOR VIN23 (MISCELLANEOUS)
WATER STERILE IRR 250ML POUR (IV SOLUTION) ×6 IMPLANT
WIRE CRE 18-20MM 8CM F G (MISCELLANEOUS) IMPLANT

## 2021-03-06 NOTE — Interval H&P Note (Signed)
Larry Lame, MD [address] [place], [place] [address] Phone:[phone] Fax : [phone]  Primary Care Physician:  Larry Aus, MD Primary Gastroenterologist:  Dr. Allen Russell  Pre-Procedure History & Physical: HPI:  Larry Russell is a 67 y.o. male is here for an endoscopy and colonoscopy.   Past Medical History:  Diagnosis Date   Arthritis    GERD (gastroesophageal reflux disease)    Hyperlipidemia    Neuropathy    feet   Psoriasis    TB (tuberculosis) contact    Father had TB when Larry Russell was a child.  Larry Russell tests show (+).  X-rays are clear.   Wears contact lenses     Past Surgical History:  Procedure Laterality Date   APPENDECTOMY     LEG SURGERY Right    TONSILLECTOMY      Prior to Admission medications   Medication Sig Start Date End Date Taking? Authorizing Provider  Adalimumab (HUMIRA) 40 MG/0.4ML PSKT Inject 40 mg into the skin every 14 (fourteen) days. For maintenance. 03/24/20  Yes Larry Bathe, MD  Ascorbic Acid (VITAMIN C) 1000 MG tablet Take 1,000 mg by mouth daily.   Yes [provider]  cholecalciferol (VITAMIN D3) 25 MCG (1000 UNIT) tablet Take 1,000 Units by mouth daily.   Yes [provider]  Coenzyme Q10 (COQ10) 100 MG CAPS Take 100 mg by mouth every other day. Takes with Pravastatin   Yes [provider]  Multiple Vitamins-Minerals (MULTIVITAMIN WITH MINERALS) tablet Take 1 tablet by mouth daily.   Yes [provider]  omeprazole (PRILOSEC) 40 MG capsule Take 40 mg by mouth daily. 09/01/19  Yes [provider]  pravastatin (PRAVACHOL) 20 MG tablet Take 20 mg by mouth every other day. 09/01/19  Yes [provider]  senna-docusate (SENOKOT-S) 8.6-50 MG tablet Take 2 tablets by mouth at bedtime. 07/19/20  Yes Larry Haff, MD  vitamin B-12 (CYANOCOBALAMIN) 1000 MCG tablet Take 1,000 mcg by mouth daily.   Yes [provider]  mometasone (ELOCON) 0.1 % cream Apply 1  application topically daily as needed (Rash). 04/28/20   Larry Bathe, MD  mometasone (ELOCON) 0.1 % lotion Apply to scalp 5 nights a week 04/28/20   Larry Bathe, MD  polyethylene glycol (MIRALAX / GLYCOLAX) 17 g packet Take 17 g by mouth daily as needed for moderate constipation. 07/19/20   Larry Haff, MD  Secukinumab, 300 MG Dose, (COSENTYX SENSOREADY, 300 MG,) 150 MG/ML SOAJ Inject 300 mg into the skin as directed. On week 0, 1, 2, 3 and 4. Patient not taking: Reported on 02/21/2021 02/21/21   Larry Bathe, MD  Secukinumab, 300 MG Dose, (COSENTYX SENSOREADY, 300 MG,) 150 MG/ML SOAJ Inject 300 mg into the skin every 28 (twenty-eight) days. For maintenance. Patient not taking: Reported on 02/21/2021 02/21/21   Larry Bathe, MD  Tapinarof [facility]) 1 % CREA Apply 1 application topically daily. Qd to aa psoriasis on body 02/01/21   Larry Bathe, MD    Allergies as of 02/22/2021 - Review Complete 02/21/2021  Allergen Reaction Noted   Celebrex [celecoxib] Itching 01/30/2013   Kiwi extract Itching 09/04/2016   Meloxicam Swelling 07/14/2020   Shellfish allergy Hives and Itching 01/30/2013    Family History  Problem Relation Age of Onset   Cancer Mother    Heart disease Father    Psoriasis Father     Social History   Socioeconomic History   Marital status: Married  Spouse name: Not on file   Number of children: Not on file   Years of education: Not on file   Highest education level: Not on file  Occupational History   Not on file  Tobacco Use   Smoking status: Never   Smokeless tobacco: Never  Vaping Use   Vaping Use: Never used  Substance and Sexual Activity   Alcohol use: No   Drug use: No   Sexual activity: Not on file  Other Topics Concern   Not on file  Social History Narrative   Not on file   Social Determinants of Health   Financial Resource Strain: Not on file  Food Insecurity: Not on file  Transportation Needs: Not on file  Physical  Activity: Not on file  Stress: Not on file  Social Connections: Not on file  Intimate Partner Violence: Not on file    Review of Systems: See HPI, otherwise negative ROS  Physical Exam: Ht 6' (1.829 m)    Wt 89.4 kg    BMI 26.72 kg/m  General:   Alert,  pleasant and cooperative in NAD Head:  Normocephalic and atraumatic. Neck:  Supple; no masses or thyromegaly. Lungs:  Clear throughout to auscultation.    Heart:  Regular rate and rhythm. Abdomen:  Soft, nontender and nondistended. Normal bowel sounds, without guarding, and without rebound.   Neurologic:  Alert and  oriented x4;  grossly normal neurologically.  Impression/Plan: Larry Russell is here for an endoscopy and colonoscopy to be performed for barrett's esophagus and family history of colon cancer  Risks, benefits, limitations, and alternatives regarding  endoscopy and colonoscopy have been reviewed with the patient.  Questions have been answered.  All parties agreeable.   Larry Lame, MD  03/06/2021, 8:28 AM

## 2021-03-06 NOTE — Transfer of Care (Signed)
Immediate Anesthesia Transfer of Care Note  Patient: Larry Russell  Procedure(s) Performed: COLONOSCOPY WITH PROPOFOL (Rectum) ESOPHAGOGASTRODUODENOSCOPY WITH BIOPSY (Esophagus)  Patient Location: PACU  Anesthesia Type: General  Level of Consciousness: awake, alert  and patient cooperative  Airway and Oxygen Therapy: Patient Spontanous Breathing and Patient connected to supplemental oxygen  Post-op Assessment: Post-op Vital signs reviewed, Patient's Cardiovascular Status Stable, Respiratory Function Stable, Patent Airway and No signs of Nausea or vomiting  Post-op Vital Signs: Reviewed and stable  Complications: No notable events documented.

## 2021-03-06 NOTE — Anesthesia Preprocedure Evaluation (Signed)
Anesthesia Evaluation  Patient identified by MRN, date of birth, ID band Patient awake    Reviewed: Allergy & Precautions, H&P , NPO status , Patient's Chart, lab work & pertinent test results  Airway Mallampati: II  TM Distance: >3 FB Neck ROM: full    Dental no notable dental hx.    Pulmonary    Pulmonary exam normal breath sounds clear to auscultation       Cardiovascular Normal cardiovascular exam Rhythm:regular Rate:Normal     Neuro/Psych    GI/Hepatic GERD  ,  Endo/Other    Renal/GU      Musculoskeletal   Abdominal   Peds  Hematology   Anesthesia Other Findings   Reproductive/Obstetrics                             Anesthesia Physical Anesthesia Plan  ASA: 2  Anesthesia Plan: General   Post-op Pain Management: Minimal or no pain anticipated   Induction: Intravenous  PONV Risk Score and Plan: 2 and Treatment may vary due to age or medical condition, Propofol infusion and TIVA  Airway Management Planned: Natural Airway  Additional Equipment:   Intra-op Plan:   Post-operative Plan:   Informed Consent: I have reviewed the patients History and Physical, chart, labs and discussed the procedure including the risks, benefits and alternatives for the proposed anesthesia with the patient or authorized representative who has indicated his/her understanding and acceptance.     Dental Advisory Given  Plan Discussed with: CRNA  Anesthesia Plan Comments:         Anesthesia Quick Evaluation

## 2021-03-06 NOTE — Anesthesia Postprocedure Evaluation (Signed)
Anesthesia Post Note  Patient: Larry Russell  Procedure(s) Performed: COLONOSCOPY WITH PROPOFOL (Rectum) ESOPHAGOGASTRODUODENOSCOPY WITH BIOPSY (Esophagus)     Patient location during evaluation: PACU Anesthesia Type: General Level of consciousness: awake and alert and oriented Pain management: satisfactory to patient Vital Signs Assessment: post-procedure vital signs reviewed and stable Respiratory status: spontaneous breathing, nonlabored ventilation and respiratory function stable Cardiovascular status: blood pressure returned to baseline and stable Postop Assessment: Adequate PO intake and No signs of nausea or vomiting Anesthetic complications: no   No notable events documented.  Raliegh Ip

## 2021-03-06 NOTE — Anesthesia Procedure Notes (Signed)
Date/Time: 03/06/2021 9:23 AM Performed by: Cameron Ali, CRNA Pre-anesthesia Checklist: Patient identified, Emergency Drugs available, Suction available, Timeout performed and Patient being monitored Patient Re-evaluated:Patient Re-evaluated prior to induction Oxygen Delivery Method: Nasal cannula Placement Confirmation: positive ETCO2

## 2021-03-06 NOTE — Op Note (Signed)
[facility] Gastroenterology Patient Name: Larry Russell Procedure Date: 03/06/2021 9:22 AM MRN: [id number] Account #: [id number] Date of Birth: 1955-06-24 Admit Type: Outpatient Age: 66 Room: [facility] OR ROOM 01 Gender: Male Note Status: Finalized Instrument Name: [id number] Procedure:             Colonoscopy Indications:           Screening for colorectal malignant neoplasm, Family                         history of colon cancer in a first-degree. Providers:             Lucilla Lame MD, MD Referring MD:          Rusty Aus, MD (Referring MD) Medicines:             Propofol per Anesthesia Complications:         No immediate complications. Procedure:             Pre-Anesthesia Assessment:                        - Prior to the procedure, a History and Physical was                         performed, and patient medications and allergies were                         reviewed. The patient's tolerance of previous                         anesthesia was also reviewed. The risks and benefits                         of the procedure and the sedation options and risks                         were discussed with the patient. All questions were                         answered, and informed consent was obtained. Prior                         Anticoagulants: The patient has taken no previous                         anticoagulant or antiplatelet agents. ASA Grade                         Assessment: II - A patient with mild systemic disease.                         After reviewing the risks and benefits, the patient                         was deemed in satisfactory condition to undergo the                         procedure.  After obtaining informed consent, the colonoscope was                         passed under direct vision. Throughout the procedure,                         the patient's blood pressure, pulse, and oxygen                          saturations were monitored continuously. The                         Colonoscope was introduced through the anus and                         advanced to the the cecum, identified by appendiceal                         orifice and ileocecal valve. The colonoscopy was                         performed without difficulty. The patient tolerated                         the procedure well. The quality of the bowel                         preparation was excellent. Findings:      The perianal and digital rectal examinations were normal.      Non-bleeding internal hemorrhoids were found during retroflexion. The       hemorrhoids were Grade II (internal hemorrhoids that prolapse but reduce       spontaneously).      The exam was otherwise without abnormality. Impression:            - Non-bleeding internal hemorrhoids.                        - The examination was otherwise normal.                        - No specimens collected. Recommendation:        - Discharge patient to home.                        - Resume previous diet.                        - Continue present medications.                        - Repeat colonoscopy in 5 years for surveillance. Procedure Code(s):     --- Professional ---                        [id number], Colonoscopy, flexible; diagnostic, including                         collection of specimen(s) by brushing or washing, when  performed (separate procedure) Diagnosis Code(s):     --- Professional ---                        Z12.11, Encounter for screening for malignant neoplasm                         of colon CPT copyright 2019 American Medical Association. All rights reserved. The codes documented in this report are preliminary and upon coder review may  be revised to meet current compliance requirements. Lucilla Lame MD, MD 03/06/2021 9:46:29 AM This report has been signed electronically. Number of Addenda: 0 Note Initiated On: 03/06/2021 9:22  AM Scope Withdrawal Time: 0 hours 7 minutes 20 seconds  Total Procedure Duration: 0 hours 10 minutes 0 seconds  Estimated Blood Loss:  Estimated blood loss: none.      [facility]

## 2021-03-06 NOTE — Op Note (Signed)
[facility] Gastroenterology Patient Name: Larry Russell Procedure Date: 03/06/2021 9:23 AM MRN: [id number] Account #: [id number] Date of Birth: [birth year] Admit Type: Outpatient Age: 66 Room: [facility] OR ROOM 1 Gender: Male Note Status: Finalized Instrument Name: [id number] Procedure:             Upper GI endoscopy Indications:           Follow-up of Barrett's esophagus Providers:             Lucilla Lame MD, MD Referring MD:          Rusty Aus, MD (Referring MD) Medicines:             Propofol per Anesthesia Complications:         No immediate complications. Procedure:             Pre-Anesthesia Assessment:                        - Prior to the procedure, a History and Physical was                         performed, and patient medications and allergies were                         reviewed. The patient's tolerance of previous                         anesthesia was also reviewed. The risks and benefits                         of the procedure and the sedation options and risks                         were discussed with the patient. All questions were                         answered, and informed consent was obtained. Prior                         Anticoagulants: The patient has taken no previous                         anticoagulant or antiplatelet agents. ASA Grade                         Assessment: II - A patient with mild systemic disease.                         After reviewing the risks and benefits, the patient                         was deemed in satisfactory condition to undergo the                         procedure.                        After obtaining informed consent, the endoscope was  passed under direct vision. Throughout the procedure,                         the patient's blood pressure, pulse, and oxygen                         saturations were monitored continuously. The was                         introduced through  the mouth, and advanced to the                         second part of duodenum. The upper GI endoscopy was                         accomplished without difficulty. The patient tolerated                         the procedure well. Findings:      There were esophageal mucosal changes consistent with long-segment       Barrett's esophagus present in the distal esophagus. The maximum       longitudinal extent of these mucosal changes was 3 cm in length. Mucosa       was biopsied with a cold forceps for histology in a targeted manner at       intervals of 2 cm. A total of 2 specimen bottles were sent to pathology.      The stomach was normal.      The examined duodenum was normal. Impression:            - Esophageal mucosal changes consistent with                         long-segment Barrett's esophagus. Biopsied.                        - Normal stomach.                        - Normal examined duodenum. Recommendation:        - Discharge patient to home.                        - Resume previous diet.                        - Continue present medications.                        - Await pathology results.                        - Repeat upper endoscopy in 3 years for surveillance.                        - Perform a colonoscopy today. Procedure Code(s):     --- Professional ---                        [phone], Esophagogastroduodenoscopy, flexible,  transoral; with biopsy, single or multiple Diagnosis Code(s):     --- Professional ---                        K22.70, Barrett's esophagus without dysplasia CPT copyright 2019 American Medical Association. All rights reserved. The codes documented in this report are preliminary and upon coder review may  be revised to meet current compliance requirements. Lucilla Lame MD, MD 03/06/2021 9:32:29 AM This report has been signed electronically. Number of Addenda: 0 Note Initiated On: 03/06/2021 9:23 AM Total Procedure Duration: 0  hours 3 minutes 1 second  Estimated Blood Loss:  Estimated blood loss: none.      [facility]

## 2021-03-07 ENCOUNTER — Encounter: Payer: Self-pay | Admitting: Gastroenterology

## 2021-03-07 LAB — SURGICAL PATHOLOGY

## 2021-03-09 ENCOUNTER — Encounter: Payer: Self-pay | Admitting: Gastroenterology

## 2021-03-13 DIAGNOSIS — Z111 Encounter for screening for respiratory tuberculosis: Secondary | ICD-10-CM | POA: Diagnosis not present

## 2021-03-13 DIAGNOSIS — M2012 Hallux valgus (acquired), left foot: Secondary | ICD-10-CM | POA: Diagnosis not present

## 2021-03-13 DIAGNOSIS — M79641 Pain in right hand: Secondary | ICD-10-CM | POA: Diagnosis not present

## 2021-03-13 DIAGNOSIS — M5116 Intervertebral disc disorders with radiculopathy, lumbar region: Secondary | ICD-10-CM | POA: Diagnosis not present

## 2021-03-13 DIAGNOSIS — M2011 Hallux valgus (acquired), right foot: Secondary | ICD-10-CM | POA: Diagnosis not present

## 2021-03-13 DIAGNOSIS — Z7960 Long term (current) use of unspecified immunomodulators and immunosuppressants: Secondary | ICD-10-CM | POA: Diagnosis not present

## 2021-03-13 DIAGNOSIS — M79642 Pain in left hand: Secondary | ICD-10-CM | POA: Diagnosis not present

## 2021-03-13 DIAGNOSIS — L409 Psoriasis, unspecified: Secondary | ICD-10-CM | POA: Diagnosis not present

## 2021-03-13 DIAGNOSIS — M19072 Primary osteoarthritis, left ankle and foot: Secondary | ICD-10-CM | POA: Diagnosis not present

## 2021-03-13 DIAGNOSIS — M7989 Other specified soft tissue disorders: Secondary | ICD-10-CM | POA: Diagnosis not present

## 2021-03-13 DIAGNOSIS — L4050 Arthropathic psoriasis, unspecified: Secondary | ICD-10-CM | POA: Diagnosis not present

## 2021-03-13 DIAGNOSIS — M159 Polyosteoarthritis, unspecified: Secondary | ICD-10-CM | POA: Diagnosis not present

## 2021-03-13 DIAGNOSIS — M19071 Primary osteoarthritis, right ankle and foot: Secondary | ICD-10-CM | POA: Diagnosis not present

## 2021-03-16 DIAGNOSIS — M5116 Intervertebral disc disorders with radiculopathy, lumbar region: Secondary | ICD-10-CM | POA: Diagnosis not present

## 2021-03-16 DIAGNOSIS — N451 Epididymitis: Secondary | ICD-10-CM | POA: Diagnosis not present

## 2021-03-23 DIAGNOSIS — H524 Presbyopia: Secondary | ICD-10-CM | POA: Diagnosis not present

## 2021-03-23 DIAGNOSIS — H5203 Hypermetropia, bilateral: Secondary | ICD-10-CM | POA: Diagnosis not present

## 2021-03-23 DIAGNOSIS — H52209 Unspecified astigmatism, unspecified eye: Secondary | ICD-10-CM | POA: Diagnosis not present

## 2021-03-29 ENCOUNTER — Telehealth: Payer: Self-pay

## 2021-03-29 NOTE — Telephone Encounter (Signed)
Spoke with patient and spouse today. He started receiving samples of Cosentyx but has not started yet. He last injection of Humira was 1 week ago AND Dr. Posey Pronto has started patient on Methotrexate. When should he start Cosentyx?

## 2021-03-29 NOTE — Telephone Encounter (Signed)
Patient advised of information per Dr. Kowalski. aw 

## 2021-04-04 ENCOUNTER — Ambulatory Visit (INDEPENDENT_AMBULATORY_CARE_PROVIDER_SITE_OTHER): Payer: No Typology Code available for payment source

## 2021-04-04 ENCOUNTER — Encounter: Payer: Self-pay | Admitting: Podiatry

## 2021-04-04 ENCOUNTER — Other Ambulatory Visit: Payer: Self-pay

## 2021-04-04 ENCOUNTER — Ambulatory Visit: Payer: No Typology Code available for payment source | Admitting: Podiatry

## 2021-04-04 DIAGNOSIS — L4059 Other psoriatic arthropathy: Secondary | ICD-10-CM | POA: Insufficient documentation

## 2021-04-04 DIAGNOSIS — M2012 Hallux valgus (acquired), left foot: Secondary | ICD-10-CM

## 2021-04-04 DIAGNOSIS — L409 Psoriasis, unspecified: Secondary | ICD-10-CM | POA: Insufficient documentation

## 2021-04-04 DIAGNOSIS — Z79899 Other long term (current) drug therapy: Secondary | ICD-10-CM | POA: Insufficient documentation

## 2021-04-04 DIAGNOSIS — M2011 Hallux valgus (acquired), right foot: Secondary | ICD-10-CM

## 2021-04-04 DIAGNOSIS — M4690 Unspecified inflammatory spondylopathy, site unspecified: Secondary | ICD-10-CM | POA: Insufficient documentation

## 2021-04-04 DIAGNOSIS — E663 Overweight: Secondary | ICD-10-CM | POA: Insufficient documentation

## 2021-04-04 DIAGNOSIS — M778 Other enthesopathies, not elsewhere classified: Secondary | ICD-10-CM | POA: Diagnosis not present

## 2021-04-04 DIAGNOSIS — Z981 Arthrodesis status: Secondary | ICD-10-CM | POA: Insufficient documentation

## 2021-04-04 DIAGNOSIS — M2550 Pain in unspecified joint: Secondary | ICD-10-CM | POA: Insufficient documentation

## 2021-04-04 NOTE — Progress Notes (Signed)
° °  Subjective: 66 y.o. male presents today as a new patient for evaluation of symptomatic forefoot pain.  Patient states that he does have bunion deformities.  Currently he wears wide fitting shoes and he is looking for conservative treatments for his bunions to prevent them from getting worse over time.  He presents for further treatment and evaluation  Past Medical History:  Diagnosis Date   Arthritis    GERD (gastroesophageal reflux disease)    Hyperlipidemia    Neuropathy    feet   Psoriasis    TB (tuberculosis) contact    Father had TB when Jeston was a child.  Tine tests show (+).  X-rays are clear.   Wears contact lenses    Past Surgical History:  Procedure Laterality Date   APPENDECTOMY     COLONOSCOPY WITH PROPOFOL N/A 03/06/2021   Procedure: COLONOSCOPY WITH PROPOFOL;  Surgeon: Lucilla Lame, MD;  Location: [facility];  Service: Endoscopy;  Laterality: N/A;   ESOPHAGOGASTRODUODENOSCOPY (EGD) WITH PROPOFOL N/A 03/06/2021   Procedure: ESOPHAGOGASTRODUODENOSCOPY WITH BIOPSY;  Surgeon: Lucilla Lame, MD;  Location: [facility];  Service: Endoscopy;  Laterality: N/A;   LEG SURGERY Right    TONSILLECTOMY     Allergies  Allergen Reactions   Celebrex [Celecoxib] Itching   Iodine Other (See Comments)   Kiwi Extract Itching   Meloxicam Swelling   Shellfish Allergy Hives and Itching    Objective: Physical Exam General: The patient is alert and oriented x3 in no acute distress.  Dermatology: Skin is cool, dry and supple bilateral lower extremities. Negative for open lesions or macerations.  Vascular: Palpable pedal pulses bilaterally. No edema or erythema noted. Capillary refill within normal limits.  Neurological: Epicritic and protective threshold grossly intact bilaterally.   Musculoskeletal Exam: Clinical evidence of bunion deformity noted to the respective foot. There is moderate pain on palpation range of motion of the first MPJ. Lateral deviation of the  hallux noted consistent with hallux abductovalgus.  Radiographic Exam: Increased intermetatarsal angle greater than 15 with a hallux abductus angle greater than 30 noted on AP view. Moderate degenerative changes noted within the first MPJ.  Assessment: 1. HAV w/ bunion deformity bilateral   Plan of Care:  1. Patient was evaluated. X-Rays reviewed. 2.  Today we discussed different treatment options both conservative and surgical for the patient.  He is not interested in surgery at the moment.  Recommend conservative treatment modalities including wide fitting shoes that do not constrict the toebox area. 3.  Silicone toe spacers were also provided to alleviate pressure from the adjacent toe 4.  Recommend OTC arch supports to support the medial longitudinal arch of the foot and alleviate pressure from the forefoot 5.  Return to clinic as needed   Edrick Kins, DPM [facility]  Dr. Edrick Kins, [address] [place]                                        [place], [place] [address]                Office [phone]  Fax [phone]

## 2021-05-02 DIAGNOSIS — R030 Elevated blood-pressure reading, without diagnosis of hypertension: Secondary | ICD-10-CM | POA: Diagnosis not present

## 2021-05-02 DIAGNOSIS — Z981 Arthrodesis status: Secondary | ICD-10-CM | POA: Diagnosis not present

## 2021-05-02 DIAGNOSIS — M47816 Spondylosis without myelopathy or radiculopathy, lumbar region: Secondary | ICD-10-CM | POA: Diagnosis not present

## 2021-05-02 DIAGNOSIS — Z6826 Body mass index (BMI) 26.0-26.9, adult: Secondary | ICD-10-CM | POA: Diagnosis not present

## 2021-05-24 ENCOUNTER — Other Ambulatory Visit: Payer: Self-pay

## 2021-05-24 MED ORDER — COSENTYX SENSOREADY (300 MG) 150 MG/ML ~~LOC~~ SOAJ: 300.0000 mg | SUBCUTANEOUS | 2 refills | Status: DC

## 2021-05-24 NOTE — Progress Notes (Signed)
Cosentyx RF RX to [employer] for patient assistance. Left message advising patient of information. aw ?

## 2021-05-25 DIAGNOSIS — R69 Illness, unspecified: Secondary | ICD-10-CM | POA: Diagnosis not present

## 2021-05-25 DIAGNOSIS — M47816 Spondylosis without myelopathy or radiculopathy, lumbar region: Secondary | ICD-10-CM | POA: Diagnosis not present

## 2021-06-12 DIAGNOSIS — M5116 Intervertebral disc disorders with radiculopathy, lumbar region: Secondary | ICD-10-CM | POA: Diagnosis not present

## 2021-06-12 DIAGNOSIS — L4050 Arthropathic psoriasis, unspecified: Secondary | ICD-10-CM | POA: Diagnosis not present

## 2021-06-12 DIAGNOSIS — L409 Psoriasis, unspecified: Secondary | ICD-10-CM | POA: Diagnosis not present

## 2021-06-12 DIAGNOSIS — Z7960 Long term (current) use of unspecified immunomodulators and immunosuppressants: Secondary | ICD-10-CM | POA: Diagnosis not present

## 2021-06-27 DIAGNOSIS — M4316 Spondylolisthesis, lumbar region: Secondary | ICD-10-CM | POA: Diagnosis not present

## 2021-06-27 DIAGNOSIS — Z6827 Body mass index (BMI) 27.0-27.9, adult: Secondary | ICD-10-CM | POA: Diagnosis not present

## 2021-07-26 ENCOUNTER — Other Ambulatory Visit: Payer: Self-pay

## 2021-07-26 MED ORDER — COSENTYX SENSOREADY (300 MG) 150 MG/ML ~~LOC~~ SOAJ: 300.0000 mg | SUBCUTANEOUS | 0 refills | Status: DC

## 2021-08-02 ENCOUNTER — Ambulatory Visit: Payer: No Typology Code available for payment source | Admitting: Dermatology

## 2021-08-02 ENCOUNTER — Encounter: Payer: Self-pay | Admitting: Dermatology

## 2021-08-02 DIAGNOSIS — L400 Psoriasis vulgaris: Secondary | ICD-10-CM

## 2021-08-02 DIAGNOSIS — Z79899 Other long term (current) drug therapy: Secondary | ICD-10-CM | POA: Diagnosis not present

## 2021-08-02 MED ORDER — COSENTYX SENSOREADY (300 MG) 150 MG/ML ~~LOC~~ SOAJ: 300.0000 mg | SUBCUTANEOUS | 6 refills | Status: DC

## 2021-08-02 NOTE — Progress Notes (Signed)
   Follow-Up Visit   Subjective  Larry Russell is a 66 y.o. male who presents for the following: Psoriasis (6 months f/u on Psoriasis, treating with Cosentyz once a month with a good response psoriasis mainly clear and taking MTX for arthritis prescribed by rheumatologist Dr Posey Pronto. ). Pt c/o headaches after he take MTX tablets.   The following portions of the chart were reviewed this encounter and updated as appropriate:   Tobacco  Allergies  Meds  Problems  Med Hx  Surg Hx  Fam Hx     Review of Systems:  No other skin or systemic complaints except as noted in HPI or Assessment and Plan.  Objective  Well appearing patient in no apparent distress; mood and affect are within normal limits.  A focused examination was performed including scalp,trunk.exts. Relevant physical exam findings are noted in the Assessment and Plan.  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   Psoriasis - severe on systemic "biologic" treatment injections.  Psoriasis is a chronic non-curable, but treatable genetic/hereditary disease that may have other systemic features affecting other organ systems such as joints (Psoriatic Arthritis).  It is linked with heart disease, inflammatory bowel disease, non-alcoholic fatty liver disease, and depression. Significant skin psoriasis and/or psoriatic arthritis may have significant symptoms and affects activities of daily activity and often benefits from systemic "biologic" injection treatments.  These "biologic" treatments have some potential side effects including immunosuppression and require pre-treatment laboratory screening and periodic laboratory monitoring and periodic in person evaluation and monitoring by the attending  dermatologist physician (long term medication management).    Labs viewed from 09/26/20   Cont Vtama cr qd to aa legs, samples Cont Clobetasol ointment 5 days a week prn flares  Cont Cosentyx injections BSA decreased to 1% on Cosentyx and topicals  Plan on repeat chest x-ray  rather than QuantiFERON gold due to the fact patient has had TB exposure in the past and labs at next office visit   Discussed with pt he will need to consult with Dr Posey Pronto concerning headaches from taking MTX.  Related Medications Secukinumab, 300 MG Dose, (COSENTYX SENSOREADY, 300 MG,) 150 MG/ML SOAJ Inject 300 mg into the skin every 28 (twenty-eight) days. For maintenance.  Return in about 7 months (around 03/04/2022) for Psoriasis-cosentyx, TBSE .  IMarye Round, CMA, am acting as scribe for Sarina Ser, MD .  Documentation: I have reviewed the above documentation for accuracy and completeness, and I agree with the above.  Sarina Ser, MD

## 2021-08-02 NOTE — Patient Instructions (Signed)
Due to recent changes in healthcare laws, you may see results of your pathology and/or laboratory studies on MyChart before the doctors have had a chance to review them. We understand that in some cases there may be results that are confusing or concerning to you. Please understand that not all results are received at the same time and often the doctors may need to interpret multiple results in order to provide you with the best plan of care or course of treatment. Therefore, we ask that you please give us 2 business days to thoroughly review all your results before contacting the office for clarification. Should we see a critical lab result, you will be contacted sooner.   If You Need Anything After Your Visit  If you have any questions or concerns for your doctor, please call our main line at [phone] and press option 4 to reach your doctor's medical assistant. If no one answers, please leave a voicemail as directed and we will return your call as soon as possible. Messages left after 4 pm will be answered the following business day.   You may also send us a message via MyChart. We typically respond to MyChart messages within 1-2 business days.  For prescription refills, please ask your pharmacy to contact our office. Our fax number is [phone].  If you have an urgent issue when the clinic is closed that cannot wait until the next business day, you can page your doctor at the number below.    Please note that while we do our best to be available for urgent issues outside of office hours, we are not available 24/7.   If you have an urgent issue and are unable to reach us, you may choose to seek medical care at your doctor's office, retail clinic, urgent care center, or emergency room.  If you have a medical emergency, please immediately call 911 or go to the emergency department.  Pager Numbers  - Dr. Kowalski: [phone]  - Dr. Moye: [phone]  - Dr. Stewart:  [phone]  In the event of inclement weather, please call our main line at [phone] for an update on the status of any delays or closures.  Dermatology Medication Tips: Please keep the boxes that topical medications come in in order to help keep track of the instructions about where and how to use these. Pharmacies typically print the medication instructions only on the boxes and not directly on the medication tubes.   If your medication is too expensive, please contact our office at [phone] option 4 or send us a message through MyChart.   We are unable to tell what your co-pay for medications will be in advance as this is different depending on your insurance coverage. However, we may be able to find a substitute medication at lower cost or fill out paperwork to get insurance to cover a needed medication.   If a prior authorization is required to get your medication covered by your insurance company, please allow us 1-2 business days to complete this process.  Drug prices often vary depending on where the prescription is filled and some pharmacies may offer cheaper prices.  The website www.goodrx.com contains coupons for medications through different pharmacies. The prices here do not account for what the cost may be with help from insurance (it may be cheaper with your insurance), but the website can give you the price if you did not use any insurance.  - You can print the associated coupon and take it with   your prescription to the pharmacy.  - You may also stop by our office during regular business hours and pick up a GoodRx coupon card.  - If you need your prescription sent electronically to a different pharmacy, notify our office through [facility] MyChart or by phone at [phone] option 4.     Si Usted Necesita Algo Despus de Su Visita  Tambin puede enviarnos un mensaje a travs de MyChart. Por lo general respondemos a los mensajes de MyChart en el transcurso de 1 a 2  das hbiles.  Para renovar recetas, por favor pida a su farmacia que se ponga en contacto con nuestra oficina. Nuestro nmero de fax es el [phone].  Si tiene un asunto urgente cuando la clnica est cerrada y que no puede esperar hasta el siguiente da hbil, puede llamar/localizar a su doctor(a) al nmero que aparece a continuacin.   Por favor, tenga en cuenta que aunque hacemos todo lo posible para estar disponibles para asuntos urgentes fuera del horario de oficina, no estamos disponibles las 24 horas del da, los 7 das de la semana.   Si tiene un problema urgente y no puede comunicarse con nosotros, puede optar por buscar atencin mdica  en el consultorio de su doctor(a), en una clnica privada, en un centro de atencin urgente o en una sala de emergencias.  Si tiene una emergencia mdica, por favor llame inmediatamente al 911 o vaya a la sala de emergencias.  Nmeros de bper  - Dr. Kowalski: [phone]  - Dra. Moye: [phone]  - Dra. Stewart: [phone]  En caso de inclemencias del tiempo, por favor llame a nuestra lnea principal al [phone] para una actualizacin sobre el estado de cualquier retraso o cierre.  Consejos para la medicacin en dermatologa: Por favor, guarde las cajas en las que vienen los medicamentos de uso tpico para ayudarle a seguir las instrucciones sobre dnde y cmo usarlos. Las farmacias generalmente imprimen las instrucciones del medicamento slo en las cajas y no directamente en los tubos del medicamento.   Si su medicamento es muy caro, por favor, pngase en contacto con nuestra oficina llamando al [phone] y presione la opcin 4 o envenos un mensaje a travs de MyChart.   No podemos decirle cul ser su copago por los medicamentos por adelantado ya que esto es diferente dependiendo de la cobertura de su seguro. Sin embargo, es posible que podamos encontrar un medicamento sustituto a menor costo o llenar un formulario para que el  seguro cubra el medicamento que se considera necesario.   Si se requiere una autorizacin previa para que su compaa de seguros cubra su medicamento, por favor permtanos de 1 a 2 das hbiles para completar este proceso.  Los precios de los medicamentos varan con frecuencia dependiendo del lugar de dnde se surte la receta y alguna farmacias pueden ofrecer precios ms baratos.  El sitio web www.goodrx.com tiene cupones para medicamentos de diferentes farmacias. Los precios aqu no tienen en cuenta lo que podra costar con la ayuda del seguro (puede ser ms barato con su seguro), pero el sitio web puede darle el precio si no utiliz ningn seguro.  - Puede imprimir el cupn correspondiente y llevarlo con su receta a la farmacia.  - Tambin puede pasar por nuestra oficina durante el horario de atencin regular y recoger una tarjeta de cupones de GoodRx.  - Si necesita que su receta se enve electrnicamente a una farmacia diferente, informe a nuestra oficina a travs de MyChart de [place]   o por telfono llamando al [phone] y presione la opcin 4.  

## 2021-09-14 DIAGNOSIS — L4050 Arthropathic psoriasis, unspecified: Secondary | ICD-10-CM | POA: Diagnosis not present

## 2021-09-14 DIAGNOSIS — M5116 Intervertebral disc disorders with radiculopathy, lumbar region: Secondary | ICD-10-CM | POA: Diagnosis not present

## 2021-09-14 DIAGNOSIS — Z7960 Long term (current) use of unspecified immunomodulators and immunosuppressants: Secondary | ICD-10-CM | POA: Diagnosis not present

## 2021-09-14 DIAGNOSIS — M159 Polyosteoarthritis, unspecified: Secondary | ICD-10-CM | POA: Diagnosis not present

## 2021-09-14 DIAGNOSIS — L409 Psoriasis, unspecified: Secondary | ICD-10-CM | POA: Diagnosis not present

## 2021-09-27 DIAGNOSIS — E538 Deficiency of other specified B group vitamins: Secondary | ICD-10-CM | POA: Diagnosis not present

## 2021-09-27 DIAGNOSIS — Z125 Encounter for screening for malignant neoplasm of prostate: Secondary | ICD-10-CM | POA: Diagnosis not present

## 2021-09-27 DIAGNOSIS — E782 Mixed hyperlipidemia: Secondary | ICD-10-CM | POA: Diagnosis not present

## 2021-10-04 DIAGNOSIS — Z0000 Encounter for general adult medical examination without abnormal findings: Secondary | ICD-10-CM | POA: Diagnosis not present

## 2021-10-04 DIAGNOSIS — E538 Deficiency of other specified B group vitamins: Secondary | ICD-10-CM | POA: Diagnosis not present

## 2021-10-04 DIAGNOSIS — M5417 Radiculopathy, lumbosacral region: Secondary | ICD-10-CM | POA: Diagnosis not present

## 2021-10-04 DIAGNOSIS — Z125 Encounter for screening for malignant neoplasm of prostate: Secondary | ICD-10-CM | POA: Diagnosis not present

## 2021-10-04 DIAGNOSIS — E782 Mixed hyperlipidemia: Secondary | ICD-10-CM | POA: Diagnosis not present

## 2021-10-04 DIAGNOSIS — L4050 Arthropathic psoriasis, unspecified: Secondary | ICD-10-CM | POA: Diagnosis not present

## 2021-10-05 ENCOUNTER — Other Ambulatory Visit: Payer: Self-pay | Admitting: Internal Medicine

## 2021-10-05 DIAGNOSIS — M5417 Radiculopathy, lumbosacral region: Secondary | ICD-10-CM

## 2021-10-26 ENCOUNTER — Ambulatory Visit
Admission: RE | Admit: 2021-10-26 | Discharge: 2021-10-26 | Disposition: A | Discharge status: Home / Self Care | Payer: No Typology Code available for payment source | Source: Ambulatory Visit | Attending: Internal Medicine | Admitting: Internal Medicine

## 2021-10-26 DIAGNOSIS — M5417 Radiculopathy, lumbosacral region: Secondary | ICD-10-CM

## 2021-10-26 DIAGNOSIS — M545 Low back pain, unspecified: Secondary | ICD-10-CM | POA: Diagnosis not present

## 2021-10-26 DIAGNOSIS — R200 Anesthesia of skin: Secondary | ICD-10-CM | POA: Diagnosis not present

## 2021-10-26 DIAGNOSIS — M48061 Spinal stenosis, lumbar region without neurogenic claudication: Secondary | ICD-10-CM | POA: Diagnosis not present

## 2021-10-31 DIAGNOSIS — M5116 Intervertebral disc disorders with radiculopathy, lumbar region: Secondary | ICD-10-CM | POA: Diagnosis not present

## 2021-10-31 DIAGNOSIS — L4050 Arthropathic psoriasis, unspecified: Secondary | ICD-10-CM | POA: Diagnosis not present

## 2021-11-01 DIAGNOSIS — M5126 Other intervertebral disc displacement, lumbar region: Secondary | ICD-10-CM | POA: Diagnosis not present

## 2021-11-01 DIAGNOSIS — M5136 Other intervertebral disc degeneration, lumbar region: Secondary | ICD-10-CM | POA: Diagnosis not present

## 2021-11-28 DIAGNOSIS — M5126 Other intervertebral disc displacement, lumbar region: Secondary | ICD-10-CM | POA: Diagnosis not present

## 2021-11-28 DIAGNOSIS — M5416 Radiculopathy, lumbar region: Secondary | ICD-10-CM | POA: Diagnosis not present

## 2021-11-29 DIAGNOSIS — M5442 Lumbago with sciatica, left side: Secondary | ICD-10-CM | POA: Diagnosis not present

## 2021-12-07 DIAGNOSIS — M5442 Lumbago with sciatica, left side: Secondary | ICD-10-CM | POA: Diagnosis not present

## 2021-12-18 DIAGNOSIS — M5442 Lumbago with sciatica, left side: Secondary | ICD-10-CM | POA: Diagnosis not present

## 2021-12-25 DIAGNOSIS — M545 Low back pain, unspecified: Secondary | ICD-10-CM | POA: Diagnosis not present

## 2021-12-25 DIAGNOSIS — M79605 Pain in left leg: Secondary | ICD-10-CM | POA: Diagnosis not present

## 2022-01-01 DIAGNOSIS — M79605 Pain in left leg: Secondary | ICD-10-CM | POA: Diagnosis not present

## 2022-01-01 DIAGNOSIS — M545 Low back pain, unspecified: Secondary | ICD-10-CM | POA: Diagnosis not present

## 2022-01-15 DIAGNOSIS — M545 Low back pain, unspecified: Secondary | ICD-10-CM | POA: Diagnosis not present

## 2022-01-15 DIAGNOSIS — M79605 Pain in left leg: Secondary | ICD-10-CM | POA: Diagnosis not present

## 2022-01-16 DIAGNOSIS — H524 Presbyopia: Secondary | ICD-10-CM | POA: Diagnosis not present

## 2022-01-23 DIAGNOSIS — L4050 Arthropathic psoriasis, unspecified: Secondary | ICD-10-CM | POA: Diagnosis not present

## 2022-01-23 DIAGNOSIS — Z7960 Long term (current) use of unspecified immunomodulators and immunosuppressants: Secondary | ICD-10-CM | POA: Diagnosis not present

## 2022-01-23 DIAGNOSIS — L409 Psoriasis, unspecified: Secondary | ICD-10-CM | POA: Diagnosis not present

## 2022-02-01 ENCOUNTER — Ambulatory Visit: Payer: No Typology Code available for payment source | Admitting: Dermatology

## 2022-02-01 DIAGNOSIS — L4050 Arthropathic psoriasis, unspecified: Secondary | ICD-10-CM

## 2022-02-01 DIAGNOSIS — L409 Psoriasis, unspecified: Secondary | ICD-10-CM | POA: Diagnosis not present

## 2022-02-01 DIAGNOSIS — Z0100 Encounter for examination of eyes and vision without abnormal findings: Secondary | ICD-10-CM | POA: Diagnosis not present

## 2022-02-01 DIAGNOSIS — Z79899 Other long term (current) drug therapy: Secondary | ICD-10-CM

## 2022-02-01 MED ORDER — CLOBETASOL PROPIONATE 0.05 % EX SOLN: 1.0000 | CUTANEOUS | 6 refills | Status: DC

## 2022-02-01 NOTE — Progress Notes (Signed)
   Follow-Up Visit   Subjective  Larry Russell is a 66 y.o. male who presents for the following: Psoriasis (Legs, groin, scalp, nails, 65mf/u, pt was on Cosentyx and felt like he worsened, Dr. PPosey Prontoswitched pt to Humira last week but pt has not actually transitioned from Cosentyx to Humira, pt said his osteo arthritis had worsened,  Clobetasol oint qd, Vtama prn, Clobetasol sol prn to scalp, pt has tried [place] past and extreme N&V and diarrhea ).  The following portions of the chart were reviewed this encounter and updated as appropriate:   Tobacco  Allergies  Meds  Problems  Med Hx  Surg Hx  Fam Hx     Review of Systems:  No other skin or systemic complaints except as noted in HPI or Assessment and Plan.  Objective  Well appearing patient in no apparent distress; mood and affect are within normal limits.  A focused examination was performed including hands, legs. Relevant physical exam findings are noted in the Assessment and Plan.  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  Psoriasis is a chronic non-curable, but treatable genetic/hereditary disease that may have other systemic features affecting other organ systems such as joints (Psoriatic Arthritis).  It is linked with heart disease, inflammatory bowel disease, non-alcoholic fatty liver disease, and depression. Significant skin psoriasis and/or psoriatic arthritis may have significant symptoms and affects activities of daily activity and often benefits from systemic "biologic" injection treatments.  These "biologic" treatments have some potential side effects including immunosuppression and require pre-treatment laboratory screening and periodic laboratory monitoring and periodic in person evaluation and monitoring by the  attending dermatologist physician (long term medication management).   D/c Cosentyx Restart Humira sq injections qowk as prescribed by Dr. PPosey ProntoCont Clobetasol oint qd up to 5d/wk aa body avoid f/g/a Cont Vtama cr qd to aa psoriasis body, groin Cont Clobetasol sol qd up to 5d/wk aa scalp prn flares  Topical steroids (such as triamcinolone, fluocinolone, fluocinonide, mometasone, clobetasol, halobetasol, betamethasone, hydrocortisone) can cause thinning and lightening of the skin if they are used for too long in the same area. Your physician has selected the right strength medicine for your problem and area affected on the body. Please use your medication only as directed by your physician to prevent side effects.    clobetasol (TEMOVATE) 0.05 % external solution - legs, scalp, groin, nails Apply 1 Application topically as directed. Qd up to 5 days a week to affected area of psoriasis scalp as needed for flares  Related Procedures DG Chest 2 View  Related Medications Tapinarof (VTAMA) 1 % CREA Apply 1 application topically daily. Qd to aa psoriasis on body  Return in about 4 months (around 06/03/2022) for Psoriasis f/u, TBSE.  I, SOthelia Pulling RMA, am acting as scribe for DSarina Ser MD . Documentation: I have reviewed the above documentation for accuracy and completeness, and I agree with the above.  DSarina Ser MD

## 2022-02-01 NOTE — Patient Instructions (Addendum)
Stop Cosentyx Restart Humira injections loading dose and then every other week as prescribed by Dr. Posey Pronto Continue Clobetasol ointment daily up to 5 days a week affected area of psoriasis on body avoid face, groin, axilla Contue Vtama cream once daily to affected area psoriasis on body, groin Continue Clobetasol solution qd up to 5d/wk aa scalp prn flares    Due to recent changes in healthcare laws, you may see results of your pathology and/or laboratory studies on MyChart before the doctors have had a chance to review them. We understand that in some cases there may be results that are confusing or concerning to you. Please understand that not all results are received at the same time and often the doctors may need to interpret multiple results in order to provide you with the best plan of care or course of treatment. Therefore, we ask that you please give [place] 2 business days to thoroughly review all your results before contacting the office for clarification. Should we see a critical lab result, you will be contacted sooner.   If You Need Anything After Your Visit  If you have any questions or concerns for your doctor, please call our main line at [phone] and press option 4 to reach your doctor's medical assistant. If no one answers, please leave a voicemail as directed and we will return your call as soon as possible. Messages left after 4 pm will be answered the following business day.   You may also send [place] a message via [place]. We typically respond to MyChart messages within 1-2 business days.  For prescription refills, please ask your pharmacy to contact our office. Our fax number is [phone].  If you have an urgent issue when the clinic is closed that cannot wait until the next business day, you can page your doctor at the number below.    Please note that while we do our best to be available for urgent issues outside of office hours, we are not available 24/7.   If you have  an urgent issue and are unable to reach [place], you may choose to seek medical care at your doctor's office, retail clinic, urgent care center, or emergency room.  If you have a medical emergency, please immediately call 911 or go to the emergency department.  Pager Numbers  - Dr. Nehemiah Massed: [phone]  - Dr. Laurence Ferrari: [phone]  - Dr. Nicole Kindred: [phone]  In the event of inclement weather, please call our main line at [phone] for an update on the status of any delays or closures.  Dermatology Medication Tips: Please keep the boxes that topical medications come in in order to help keep track of the instructions about where and how to use these. Pharmacies typically print the medication instructions only on the boxes and not directly on the medication tubes.   If your medication is too expensive, please contact our office at [phone] option 4 or send [place] a message through [place].   We are unable to tell what your co-pay for medications will be in advance as this is different depending on your insurance coverage. However, we may be able to find a substitute medication at lower cost or fill out paperwork to get insurance to cover a needed medication.   If a prior authorization is required to get your medication covered by your insurance company, please allow [place] 1-2 business days to complete this process.  Drug prices often vary depending on where the prescription is filled and some pharmacies may offer  cheaper prices.  The website www.goodrx.com contains coupons for medications through different pharmacies. The prices here do not account for what the cost may be with help from insurance (it may be cheaper with your insurance), but the website can give you the price if you did not use any insurance.  - You can print the associated coupon and take it with your prescription to the pharmacy.  - You may also stop by our office during regular business hours and pick up a GoodRx coupon card.   - If you need your prescription sent electronically to a different pharmacy, notify our office through [facility] or by phone at [phone] option 4.     Si Usted Necesita Algo Despus de Su Visita  Tambin puede enviarnos un mensaje a travs de Pharmacist, community. Por lo general respondemos a los mensajes de MyChart en el transcurso de 1 a 2 das hbiles.  Para renovar recetas, por favor pida a su farmacia que se ponga en contacto con nuestra oficina. Harland Dingwall de fax es [place] [phone].  Si tiene un asunto urgente cuando la clnica est cerrada y que no puede esperar hasta el siguiente da hbil, puede llamar/localizar a su doctor(a) al nmero que aparece a continuacin.   Por favor, tenga en cuenta que aunque hacemos todo lo posible para estar disponibles para asuntos urgentes fuera del horario de [place], no estamos disponibles las 24 horas del da, los 7 das de la [place].   Si tiene un problema urgente y no puede comunicarse con nosotros, puede optar por buscar atencin mdica  en el consultorio de su doctor(a), en una clnica privada, en un centro de atencin urgente o en una sala de emergencias.  Si tiene Engineering geologist, por favor llame inmediatamente al 911 o vaya a la sala de emergencias.  Nmeros de bper  - Dr. Nehemiah Massed: [phone]  - Dra. Moye: [phone]  - Dra. Nicole Kindred: [phone]  En caso de inclemencias del [place], por favor llame a Johnsie Kindred principal al [phone] para una actualizacin sobre el [place] de cualquier retraso o cierre.  Consejos para la medicacin en dermatologa: Por favor, guarde las cajas en las que vienen los medicamentos de uso tpico para ayudarle a seguir las instrucciones sobre dnde y cmo usarlos. Las farmacias generalmente imprimen las instrucciones del medicamento slo en las cajas y no directamente en los tubos del [place].   Si su medicamento es muy caro, por favor, pngase en contacto con Zigmund Daniel  llamando al [phone] y presione la opcin 4 o envenos un mensaje a travs de Pharmacist, community.   No podemos decirle cul ser su copago por los medicamentos por adelantado ya que esto es diferente dependiendo de la cobertura de su seguro. Sin embargo, es posible que podamos encontrar un medicamento sustituto a Electrical engineer un formulario para que el seguro cubra el medicamento que se considera necesario.   Si se requiere una autorizacin previa para que su compaa de seguros [place] su medicamento, por favor permtanos de 1 a 2 das hbiles para completar este proceso.  Los precios de los medicamentos varan con frecuencia dependiendo del Environmental consultant de dnde se surte la receta y alguna farmacias pueden ofrecer precios ms baratos.  El sitio web www.goodrx.com tiene cupones para medicamentos de Airline pilot. Los precios aqu no tienen en cuenta lo que podra costar con la ayuda del seguro (puede ser ms barato con su seguro), pero el sitio web puede darle el precio si no utiliz ningn  seguro.  - Puede imprimir el cupn correspondiente y llevarlo con su receta a la farmacia.  - Tambin puede pasar por nuestra oficina durante el horario de atencin regular y Charity fundraiser una tarjeta de cupones de GoodRx.  - Si necesita que su receta se enve electrnicamente a una farmacia diferente, informe a nuestra oficina a travs de MyChart de [place] o por telfono llamando al [phone] y presione la opcin 4.

## 2022-02-14 DIAGNOSIS — I10 Essential (primary) hypertension: Secondary | ICD-10-CM | POA: Diagnosis not present

## 2022-02-14 DIAGNOSIS — L4050 Arthropathic psoriasis, unspecified: Secondary | ICD-10-CM | POA: Diagnosis not present

## 2022-02-14 DIAGNOSIS — G729 Myopathy, unspecified: Secondary | ICD-10-CM | POA: Diagnosis not present

## 2022-02-16 ENCOUNTER — Encounter: Payer: Self-pay | Admitting: Dermatology

## 2022-02-17 IMAGING — RF DG LUMBAR SPINE 2-3V
1 series · 2 of 2 positions shown · non-contrast
Comparison: Intraoperative localizer radiographs of the lumbar
spine performed earlier today 08/24/2020. Lumbar spine MRI
07/16/2020.

CLINICAL DATA: Surgery, elective. Additional history provided:
Lumbar 4-5 posterior lumbar interbody fusion. Provided fluoroscopy
time 13 seconds (9.72 mGy).

EXAM:
LUMBAR SPINE - 2-3 VIEW; DG C-ARM 1-60 MIN

[Series 1: run · 2 of 2 slices shown]
[im 1/2]
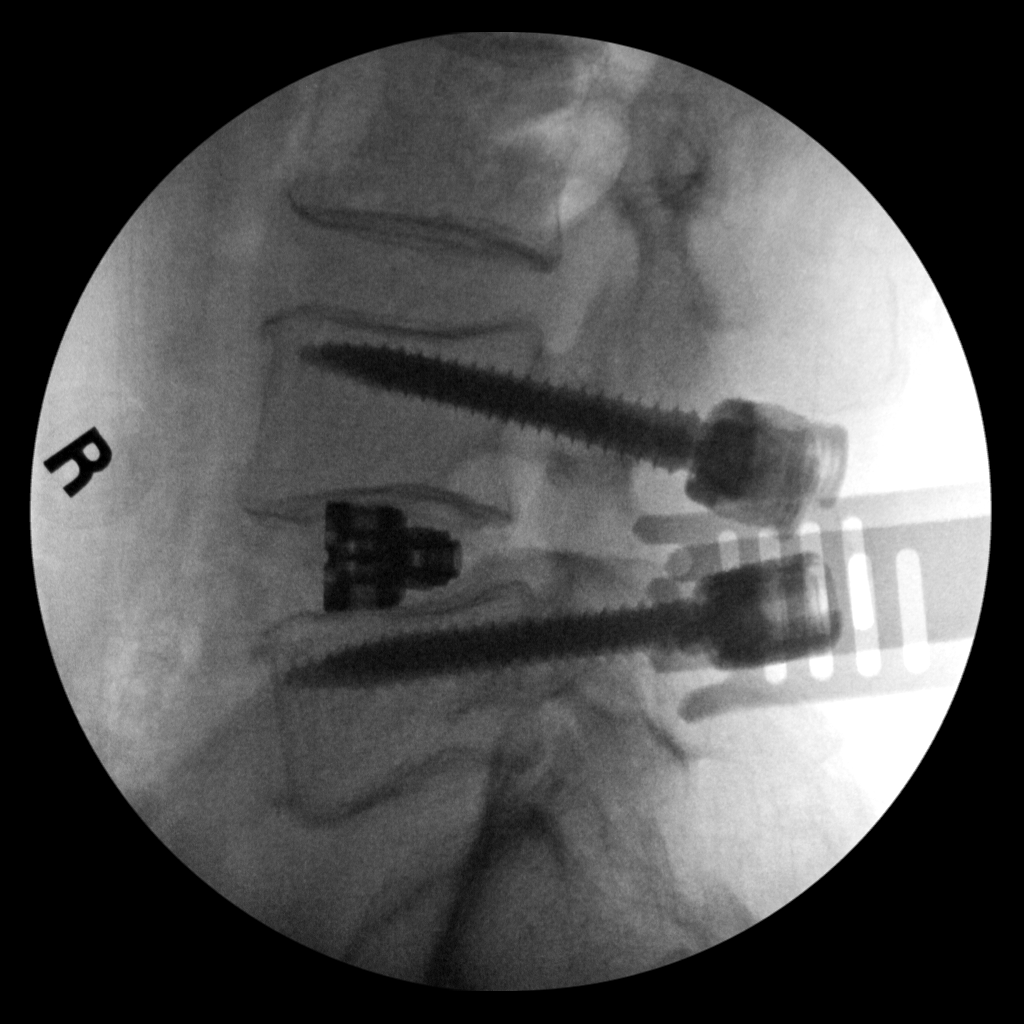
[im 2/2]
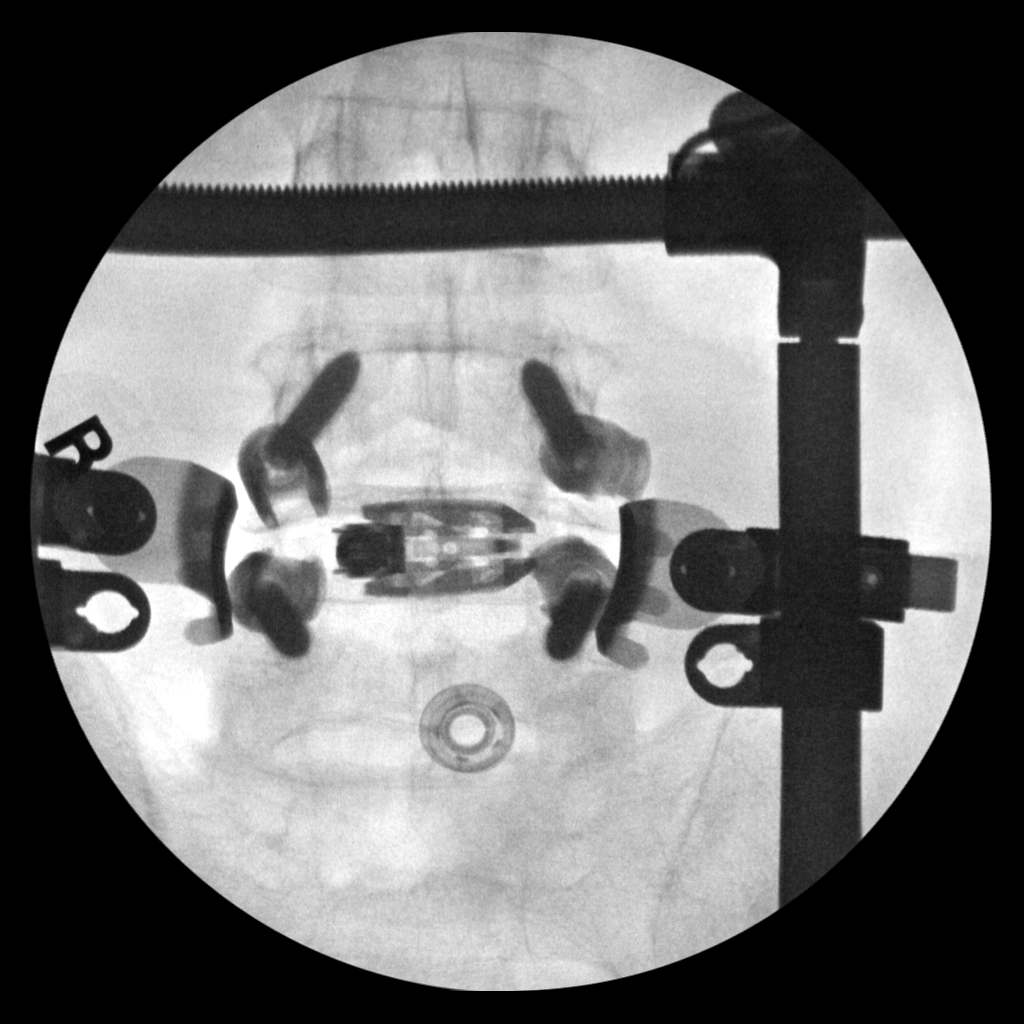

[2 of 2 positions shown; findings below may reference images not displayed]

FINDINGS: AP and lateral view intraoperative fluoroscopic images of the lumbar
spine are submitted, 2 images total. The lowest well-formed
intervertebral disc space is designated L5-S1. On the provided
images, bilateral pedicle screws are present at L4 and L5. Vertical
interconnecting rods were not present at the time the images were
taken. Interbody device(s) also present at L4-L5. Overlying
retractors.
IMPRESSION: Two intraoperative fluoroscopic images of the lumbar spine from
L4-L5 posterior fusion, as described.

## 2022-03-05 ENCOUNTER — Ambulatory Visit: Payer: No Typology Code available for payment source | Admitting: Dermatology

## 2022-04-11 ENCOUNTER — Ambulatory Visit
Admission: RE | Admit: 2022-04-11 | Discharge: 2022-04-11 | Disposition: A | Discharge status: Home / Self Care | Payer: Medicare HMO | Source: Ambulatory Visit | Attending: Dermatology | Admitting: Dermatology

## 2022-04-11 DIAGNOSIS — L409 Psoriasis, unspecified: Secondary | ICD-10-CM | POA: Insufficient documentation

## 2022-04-16 ENCOUNTER — Telehealth: Payer: Self-pay

## 2022-04-16 NOTE — Telephone Encounter (Signed)
Lft pt msg to call for chest xray results/sh

## 2022-04-16 NOTE — Telephone Encounter (Signed)
-----   Message from Ralene Bathe, MD sent at 04/13/2022 11:22 AM EST ----- CXR from 04/11/2022 is OK = clear Pt has had past exposure to TB Pt is on Humira for Psoriasis and Psoriatic Arthritis Continue treatment. Keep follow up appt.

## 2022-05-22 ENCOUNTER — Telehealth: Payer: Self-pay

## 2022-05-22 NOTE — Telephone Encounter (Signed)
-----   Message from David C Kowalski, MD sent at 04/13/2022 11:22 AM EST ----- CXR from 04/11/2022 is OK = clear Pt has had past exposure to TB Pt is on Humira for Psoriasis and Psoriatic Arthritis Continue treatment. Keep follow up appt. 

## 2022-05-22 NOTE — Telephone Encounter (Signed)
Left message on voice mail for patient to return my call.

## 2022-05-24 ENCOUNTER — Telehealth: Payer: Self-pay

## 2022-05-24 NOTE — Telephone Encounter (Signed)
Patient advised. aw 

## 2022-05-24 NOTE — Telephone Encounter (Signed)
-----   Message from David C Kowalski, MD sent at 04/13/2022 11:22 AM EST ----- CXR from 04/11/2022 is OK = clear Pt has had past exposure to TB Pt is on Humira for Psoriasis and Psoriatic Arthritis Continue treatment. Keep follow up appt. 

## 2022-06-04 ENCOUNTER — Ambulatory Visit: Payer: Medicare HMO | Admitting: Podiatry

## 2022-06-04 ENCOUNTER — Encounter: Payer: Self-pay | Admitting: Podiatry

## 2022-06-04 VITALS — BP 151/85 | HR 78

## 2022-06-04 DIAGNOSIS — M216X9 Other acquired deformities of unspecified foot: Secondary | ICD-10-CM

## 2022-06-04 DIAGNOSIS — M2011 Hallux valgus (acquired), right foot: Secondary | ICD-10-CM

## 2022-06-04 DIAGNOSIS — M7751 Other enthesopathy of right foot: Secondary | ICD-10-CM

## 2022-06-04 DIAGNOSIS — M2012 Hallux valgus (acquired), left foot: Secondary | ICD-10-CM

## 2022-06-05 NOTE — Progress Notes (Signed)
  Subjective:  Patient ID: Larry Russell, male    DOB: December 21, 1955,  MRN: [id number]  Chief Complaint  Patient presents with   Foot Pain    "This bone hurts on both my feet." N - painful bone L - 2nd met. Bilateral D - since last visit, Feb. 2023 O - gradually gotten worse / right >left C - shooting pain, like walking on a rock, A - walking barefoot T - Diclofenac    67 y.o. male presents with the above complaint. History confirmed with patient.   Objective:  Physical Exam: warm, good capillary refill, no trophic changes or ulcerative lesions, normal DP and PT pulses, normal sensory exam, and submetatarsal 2 pain.  No instability of MTPJ.  No evidence of neuroma   Radiographs: Multiple views x-ray of both feet previously taken: no fracture, dislocation, swelling or degenerative changes noted and elongated second metatarsal Assessment:   1. Capsulitis of metatarsophalangeal (MTP) joint of right foot   2. Acquired hallux valgus of both feet   3. Plantar flexed metatarsal, unspecified laterality      Plan:  Patient was evaluated and treated and all questions answered.  We again reviewed the etiology and treatment options of metatarsalgia and capsulitis of the MTP joint.  There is no gross instability to suggest plantar plate tear that is active.  Discussed and recommended offloading of the joints with an insert with a metatarsal pad.  Power steps were dispensed with metatarsal pads were applied.  Long-term if continues to worsen would likely agree with Dr. Logan Bores that correction of the bunion deformity and metatarsal osteotomy would be indicated.  Return if symptoms worsen or fail to improve.

## 2022-06-19 ENCOUNTER — Ambulatory Visit: Payer: Medicare HMO | Admitting: Dermatology

## 2022-06-19 VITALS — BP 150/85

## 2022-06-19 DIAGNOSIS — L729 Follicular cyst of the skin and subcutaneous tissue, unspecified: Secondary | ICD-10-CM

## 2022-06-19 DIAGNOSIS — D229 Melanocytic nevi, unspecified: Secondary | ICD-10-CM

## 2022-06-19 DIAGNOSIS — L578 Other skin changes due to chronic exposure to nonionizing radiation: Secondary | ICD-10-CM | POA: Diagnosis not present

## 2022-06-19 DIAGNOSIS — L409 Psoriasis, unspecified: Secondary | ICD-10-CM | POA: Diagnosis not present

## 2022-06-19 DIAGNOSIS — L4050 Arthropathic psoriasis, unspecified: Secondary | ICD-10-CM

## 2022-06-19 DIAGNOSIS — Z1283 Encounter for screening for malignant neoplasm of skin: Secondary | ICD-10-CM | POA: Diagnosis not present

## 2022-06-19 DIAGNOSIS — L821 Other seborrheic keratosis: Secondary | ICD-10-CM | POA: Diagnosis not present

## 2022-06-19 DIAGNOSIS — Z79899 Other long term (current) drug therapy: Secondary | ICD-10-CM

## 2022-06-19 DIAGNOSIS — L720 Epidermal cyst: Secondary | ICD-10-CM

## 2022-06-19 DIAGNOSIS — L814 Other melanin hyperpigmentation: Secondary | ICD-10-CM

## 2022-06-19 NOTE — Patient Instructions (Signed)
Due to recent changes in healthcare laws, you may see results of your pathology and/or laboratory studies on MyChart before the doctors have had a chance to review them. We understand that in some cases there may be results that are confusing or concerning to you. Please understand that not all results are received at the same time and often the doctors may need to interpret multiple results in order to provide you with the best plan of care or course of treatment. Therefore, we ask that you please give us 2 business days to thoroughly review all your results before contacting the office for clarification. Should we see a critical lab result, you will be contacted sooner.   If You Need Anything After Your Visit  If you have any questions or concerns for your doctor, please call our main line at [phone] and press option 4 to reach your doctor's medical assistant. If no one answers, please leave a voicemail as directed and we will return your call as soon as possible. Messages left after 4 pm will be answered the following business day.   You may also send us a message via MyChart. We typically respond to MyChart messages within 1-2 business days.  For prescription refills, please ask your pharmacy to contact our office. Our fax number is [phone].  If you have an urgent issue when the clinic is closed that cannot wait until the next business day, you can page your doctor at the number below.    Please note that while we do our best to be available for urgent issues outside of office hours, we are not available 24/7.   If you have an urgent issue and are unable to reach us, you may choose to seek medical care at your doctor's office, retail clinic, urgent care center, or emergency room.  If you have a medical emergency, please immediately call 911 or go to the emergency department.  Pager Numbers  - Dr. Kowalski: [phone]  - Dr. Moye: [phone]  - Dr. Stewart:  [phone]  In the event of inclement weather, please call our main line at [phone] for an update on the status of any delays or closures.  Dermatology Medication Tips: Please keep the boxes that topical medications come in in order to help keep track of the instructions about where and how to use these. Pharmacies typically print the medication instructions only on the boxes and not directly on the medication tubes.   If your medication is too expensive, please contact our office at [phone] option 4 or send us a message through MyChart.   We are unable to tell what your co-pay for medications will be in advance as this is different depending on your insurance coverage. However, we may be able to find a substitute medication at lower cost or fill out paperwork to get insurance to cover a needed medication.   If a prior authorization is required to get your medication covered by your insurance company, please allow us 1-2 business days to complete this process.  Drug prices often vary depending on where the prescription is filled and some pharmacies may offer cheaper prices.  The website www.goodrx.com contains coupons for medications through different pharmacies. The prices here do not account for what the cost may be with help from insurance (it may be cheaper with your insurance), but the website can give you the price if you did not use any insurance.  - You can print the associated coupon and take it with   your prescription to the pharmacy.  - You may also stop by our office during regular business hours and pick up a GoodRx coupon card.  - If you need your prescription sent electronically to a different pharmacy, notify our office through [place] MyChart or by phone at [phone] option 4.     Si Usted Necesita Algo Despus de Su Visita  Tambin puede enviarnos un mensaje a travs de MyChart. Por lo general respondemos a los mensajes de MyChart en el transcurso de 1 a 2  das hbiles.  Para renovar recetas, por favor pida a su farmacia que se ponga en contacto con nuestra oficina. Nuestro nmero de fax es el [phone].  Si tiene un asunto urgente cuando la clnica est cerrada y que no puede esperar hasta el siguiente da hbil, puede llamar/localizar a su doctor(a) al nmero que aparece a continuacin.   Por favor, tenga en cuenta que aunque hacemos todo lo posible para estar disponibles para asuntos urgentes fuera del horario de oficina, no estamos disponibles las 24 horas del da, los 7 das de la semana.   Si tiene un problema urgente y no puede comunicarse con nosotros, puede optar por buscar atencin mdica  en el consultorio de su doctor(a), en una clnica privada, en un centro de atencin urgente o en una sala de emergencias.  Si tiene una emergencia mdica, por favor llame inmediatamente al 911 o vaya a la sala de emergencias.  Nmeros de bper  - Dr. Kowalski: [phone]  - Dra. Moye: [phone]  - Dra. Stewart: [phone]  En caso de inclemencias del tiempo, por favor llame a nuestra lnea principal al [phone] para una actualizacin sobre el estado de cualquier retraso o cierre.  Consejos para la medicacin en dermatologa: Por favor, guarde las cajas en las que vienen los medicamentos de uso tpico para ayudarle a seguir las instrucciones sobre dnde y cmo usarlos. Las farmacias generalmente imprimen las instrucciones del medicamento slo en las cajas y no directamente en los tubos del medicamento.   Si su medicamento es muy caro, por favor, pngase en contacto con nuestra oficina llamando al [phone] y presione la opcin 4 o envenos un mensaje a travs de MyChart.   No podemos decirle cul ser su copago por los medicamentos por adelantado ya que esto es diferente dependiendo de la cobertura de su seguro. Sin embargo, es posible que podamos encontrar un medicamento sustituto a menor costo o llenar un formulario para que el  seguro cubra el medicamento que se considera necesario.   Si se requiere una autorizacin previa para que su compaa de seguros cubra su medicamento, por favor permtanos de 1 a 2 das hbiles para completar este proceso.  Los precios de los medicamentos varan con frecuencia dependiendo del lugar de dnde se surte la receta y alguna farmacias pueden ofrecer precios ms baratos.  El sitio web www.goodrx.com tiene cupones para medicamentos de diferentes farmacias. Los precios aqu no tienen en cuenta lo que podra costar con la ayuda del seguro (puede ser ms barato con su seguro), pero el sitio web puede darle el precio si no utiliz ningn seguro.  - Puede imprimir el cupn correspondiente y llevarlo con su receta a la farmacia.  - Tambin puede pasar por nuestra oficina durante el horario de atencin regular y recoger una tarjeta de cupones de GoodRx.  - Si necesita que su receta se enve electrnicamente a una farmacia diferente, informe a nuestra oficina a travs de MyChart de [place]   o por telfono llamando al [phone] y presione la opcin 4.  

## 2022-06-19 NOTE — Progress Notes (Signed)
Follow-Up Visit   Subjective  Larry Russell is a 67 y.o. male who presents for the following: Skin Cancer Screening and Full Body Skin Exam, Psoriasis with Psoriatic Arthritis and Osteoarthritis, pt currently on Humira (switched from Cosentyx to Humira 12/23) sq injections, and not having to use any topicals,  The patient presents for Total-Body Skin Exam (TBSE) for skin cancer screening and mole check. The patient has spots, moles and lesions to be evaluated, some may be new or changing and the patient has concerns that these could be cancer.    The following portions of the chart were reviewed this encounter and updated as appropriate: medications, allergies, medical history  Review of Systems:  No other skin or systemic complaints except as noted in HPI or Assessment and Plan.  Objective  Well appearing patient in no apparent distress; mood and affect are within normal limits.  A full examination was performed including scalp, head, eyes, ears, nose, lips, neck, chest, axillae, abdomen, back, buttocks, bilateral upper extremities, bilateral lower extremities, hands, feet, fingers, toes, fingernails, and toenails. All findings within normal limits unless otherwise noted below.   Relevant physical exam findings are noted in the Assessment and Plan.    Assessment & Plan   LENTIGINES, SEBORRHEIC KERATOSES, HEMANGIOMAS - Benign normal skin lesions - Benign-appearing - Call for any changes  MELANOCYTIC NEVI - Tan-brown and/or pink-flesh-colored symmetric macules and papules - Benign appearing on exam today - Observation - Call clinic for new or changing moles - Recommend daily use of broad spectrum spf 30+ sunscreen to sun-exposed areas.   ACTINIC DAMAGE - Chronic condition, secondary to cumulative UV/sun exposure - diffuse scaly erythematous macules with underlying dyspigmentation - Recommend daily broad spectrum sunscreen SPF 30+ to sun-exposed areas, reapply every 2 hours  as needed.  - Staying in the shade or wearing long sleeves, sun glasses (UVA+UVB protection) and wide brim hats (4-inch brim around the entire circumference of the hat) are also recommended for sun protection.  - Call for new or changing lesions.  SKIN CANCER SCREENING PERFORMED TODAY.  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  Psoriasis is a chronic non-curable, but treatable genetic/hereditary disease that may have other systemic features affecting other organ systems such as joints (Psoriatic Arthritis).  It is linked with heart disease, inflammatory bowel disease, non-alcoholic fatty liver disease, and depression. Significant skin psoriasis and/or psoriatic arthritis may have significant symptoms and affects activities of daily activity and often benefits from systemic treatments.  These systemic treatments have some potential side effects including immunosuppression and require pre-treatment laboratory screening and periodic laboratory monitoring and periodic in person evaluation and monitoring by the attending dermatologist physician (long term medication management).   Reviewed risks of biologics including immunosuppression, infections, injection site reaction, and failure to improve condition. Goal is control of skin condition, not cure.  Some older biologics such as Humira and Enbrel may slightly increase risk of malignancy and may worsen congestive heart failure.  Taltz and Cosentyx may cause inflammatory bowel disease to flare. The use of biologics requires long term medication management, including periodic office visits and monitoring of blood work.   Treatment Plan: Cont Humira 40mg  sq injections q 2 wks Cont Clobetasol oint qd 5d/wk prn flares Cont Clobetasol sol  qd 5d/wk aa scalp prn  flares Cont Vtama cr qd aa body prn flares  Reviewed risks of biologics including immunosuppression, infections, injection site reaction, and failure to improve condition. Goal is control of skin condition, not cure.  Some older biologics such as Humira and Enbrel may slightly increase risk of malignancy and may worsen congestive heart failure.  Taltz and Cosentyx may cause inflammatory bowel disease to flare. The use of biologics requires long term medication management, including periodic office visits and monitoring of blood work.   Topical steroids (such as triamcinolone, fluocinolone, fluocinonide, mometasone, clobetasol, halobetasol, betamethasone, hydrocortisone) can cause thinning and lightening of the skin if they are used for too long in the same area. Your physician has selected the right strength medicine for your problem and area affected on the body. Please use your medication only as directed by your physician to prevent side effects.     EPIDERMAL INCLUSION CYST Exam: small pap at L lower eyelid  Benign-appearing. Exam most consistent with an epidermal inclusion cyst. Discussed that a cyst is a benign growth that can grow over time and sometimes get irritated or inflamed. Recommend observation if it is not bothersome. Discussed option of surgical excision to remove it if it is growing, symptomatic, or other changes noted. Please call for new or changing lesions so they can be evaluated.     Return in about 6 months (around 12/19/2022) for Psoriasis f/u.  I, Larry Russell, RMA, am acting as scribe for Larry Sans, MD .   Documentation: I have reviewed the above documentation for accuracy and completeness, and I agree with the above.  Larry Sans, MD

## 2022-06-24 ENCOUNTER — Encounter: Payer: Self-pay | Admitting: Dermatology

## 2022-07-13 ENCOUNTER — Other Ambulatory Visit: Payer: Self-pay | Admitting: Internal Medicine

## 2022-07-13 DIAGNOSIS — G959 Disease of spinal cord, unspecified: Secondary | ICD-10-CM

## 2022-07-13 DIAGNOSIS — M5116 Intervertebral disc disorders with radiculopathy, lumbar region: Secondary | ICD-10-CM

## 2022-07-15 ENCOUNTER — Ambulatory Visit
Admission: RE | Admit: 2022-07-15 | Discharge: 2022-07-15 | Disposition: A | Discharge status: Home / Self Care | Payer: Medicare HMO | Source: Ambulatory Visit | Attending: Internal Medicine | Admitting: Internal Medicine

## 2022-07-15 DIAGNOSIS — M5116 Intervertebral disc disorders with radiculopathy, lumbar region: Secondary | ICD-10-CM | POA: Diagnosis not present

## 2022-07-15 DIAGNOSIS — G959 Disease of spinal cord, unspecified: Secondary | ICD-10-CM | POA: Diagnosis present

## 2022-08-11 IMAGING — CR DG CHEST 2V
1 series · 2 of 2 positions shown · non-contrast
Comparison: None.

CLINICAL DATA: 65-year-old male with screening test for TB

EXAM:
CHEST - 2 VIEW

[Series 1: dg chest 2 view · 0.14mm/px · 2 of 2 slices shown]
[im 1/2]
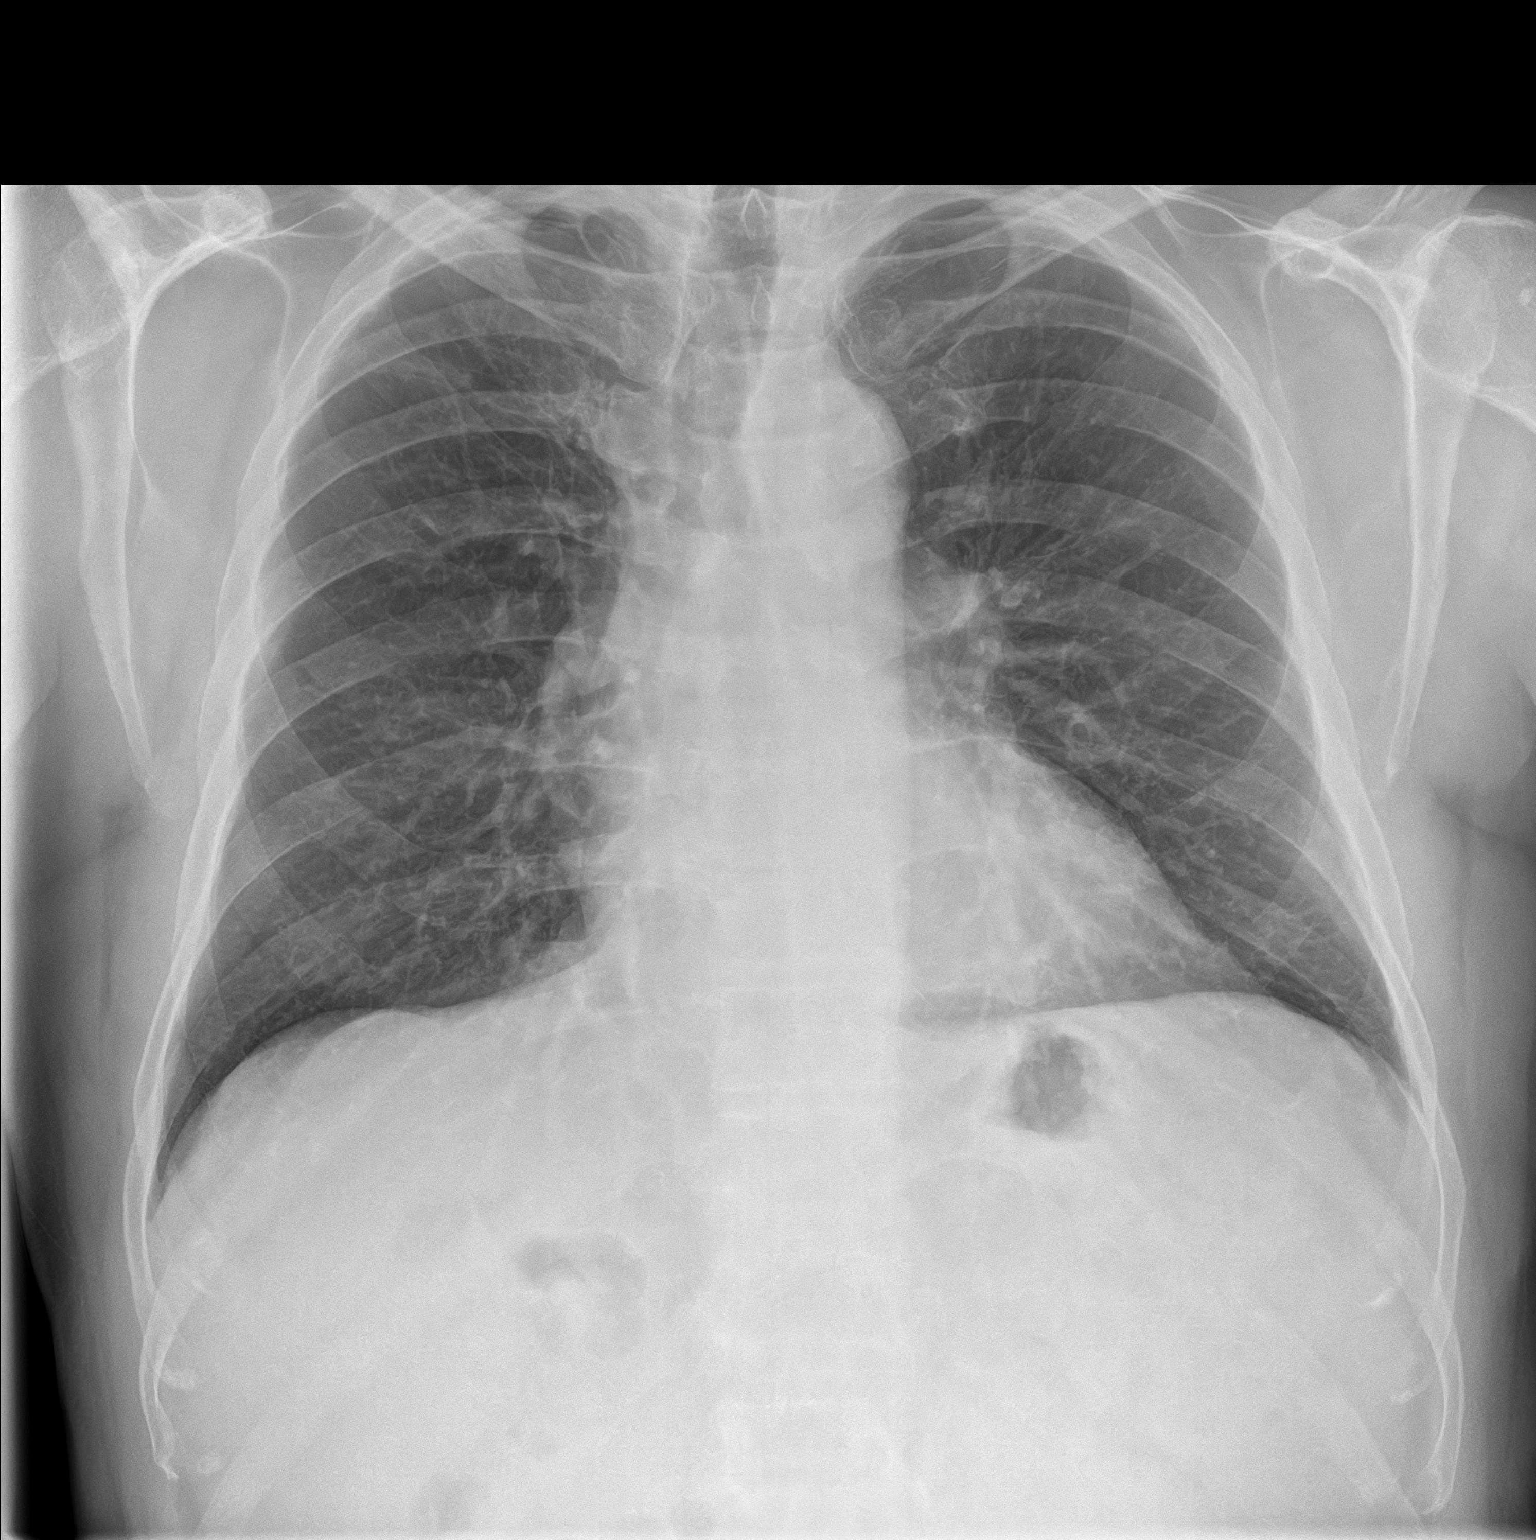
[im 2/2]
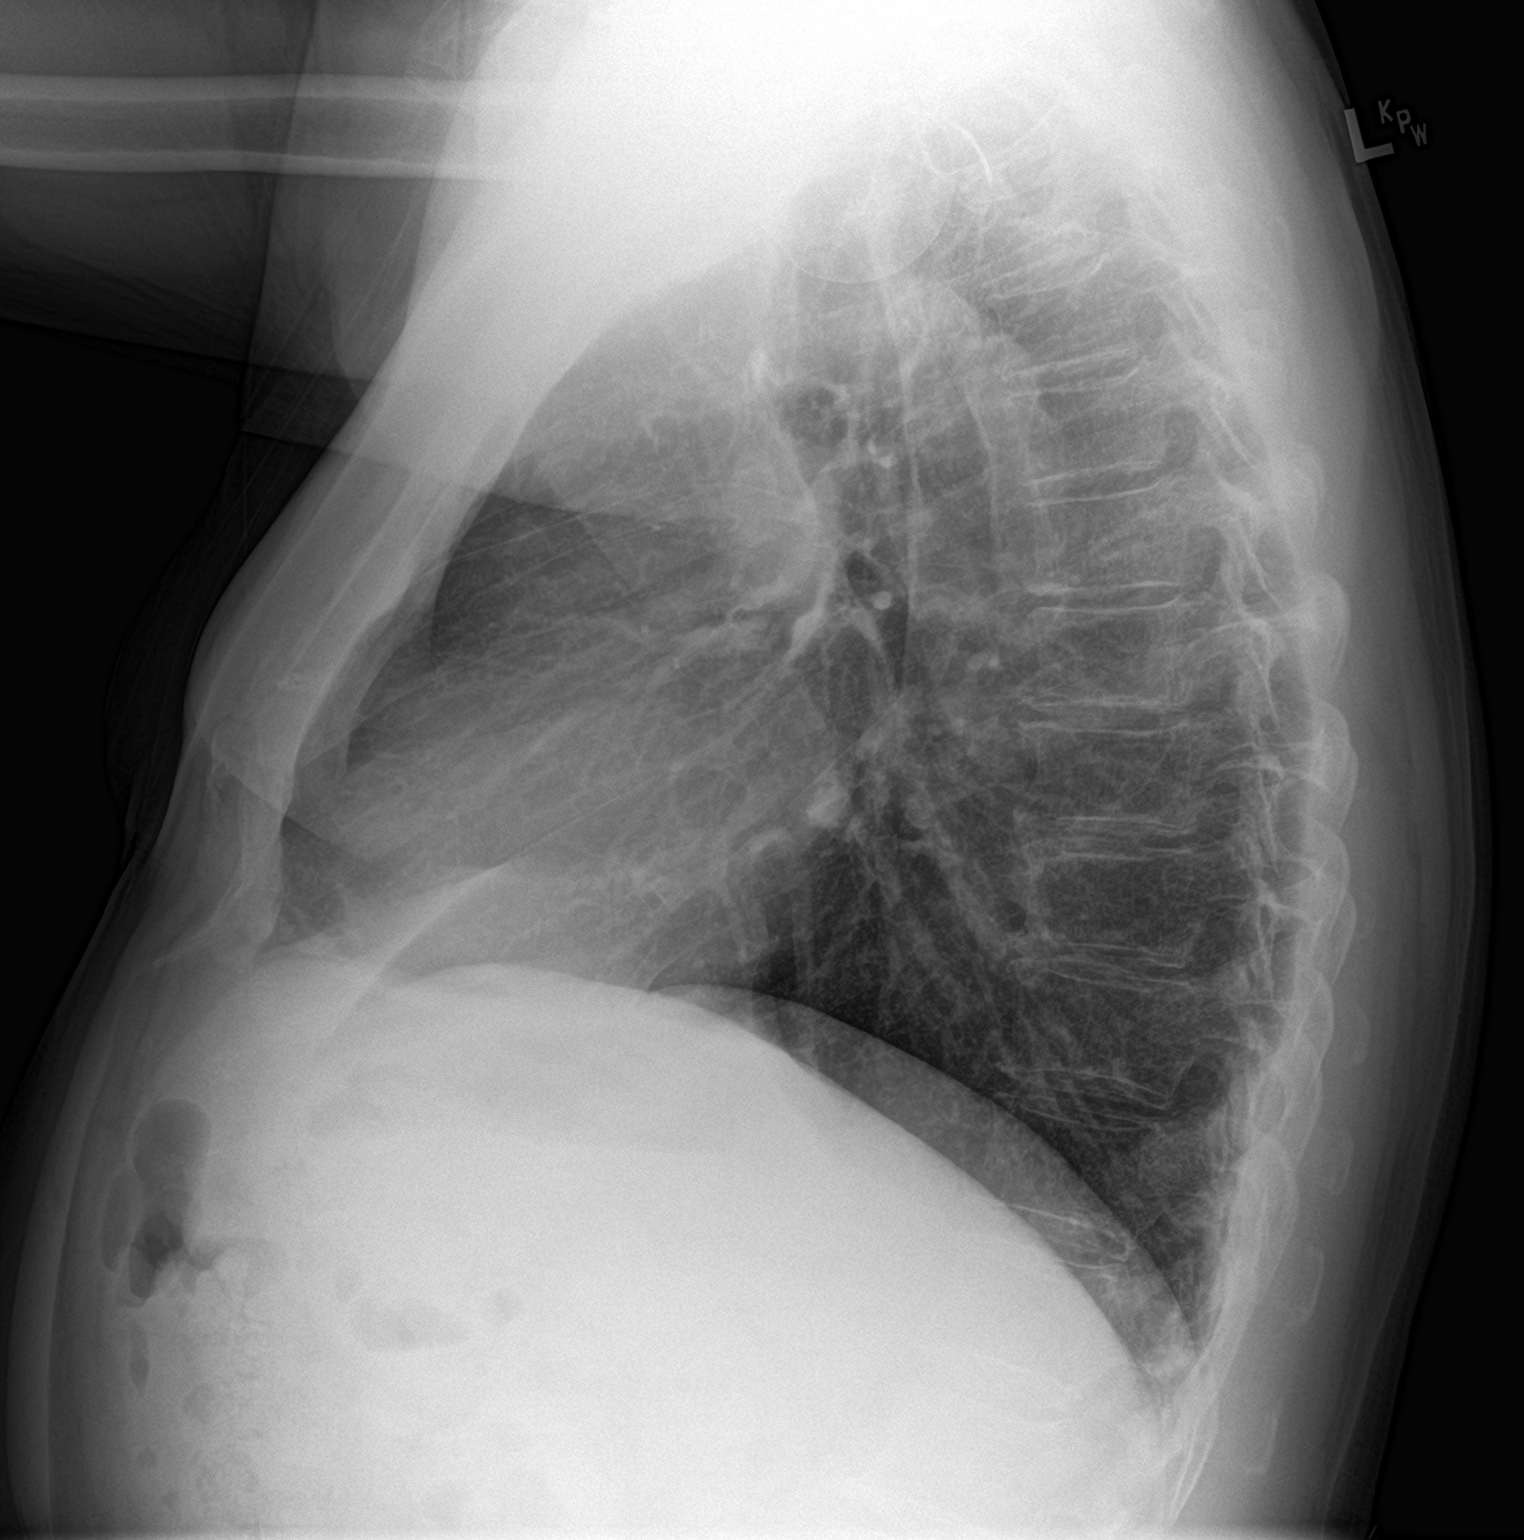

[2 of 2 positions shown; findings below may reference images not displayed]

FINDINGS: Cardiomediastinal silhouette unchanged in size and contour. No
evidence of central vascular congestion. No interlobular septal
thickening.

Scarring at the apices of the lungs, similar to the prior.

No pneumothorax or pleural effusion. Coarsened interstitial
markings, with no confluent airspace disease.

No acute displaced fracture. Degenerative changes of the spine.
IMPRESSION: No active cardiopulmonary disease.

## 2022-08-16 ENCOUNTER — Other Ambulatory Visit: Payer: Self-pay | Admitting: Neurosurgery

## 2022-08-16 DIAGNOSIS — M4807 Spinal stenosis, lumbosacral region: Secondary | ICD-10-CM

## 2022-08-24 ENCOUNTER — Ambulatory Visit
Admission: RE | Admit: 2022-08-24 | Discharge: 2022-08-24 | Disposition: A | Discharge status: Home / Self Care | Payer: Medicare HMO | Source: Ambulatory Visit | Attending: Neurosurgery | Admitting: Neurosurgery

## 2022-08-24 DIAGNOSIS — M4807 Spinal stenosis, lumbosacral region: Secondary | ICD-10-CM

## 2022-08-24 MED ORDER — ONDANSETRON HCL 4 MG/2ML IJ SOLN: 4.0000 mg | Freq: Once | INTRAMUSCULAR | Status: DC | PRN

## 2022-08-24 MED ORDER — IOPAMIDOL (ISOVUE-M 200) INJECTION 41%
20.0000 mL | Freq: Once | INTRAMUSCULAR | Status: AC
  Administered 2022-08-24: 20 mL via INTRATHECAL

## 2022-08-24 MED ORDER — MEPERIDINE HCL 50 MG/ML IJ SOLN: 50.0000 mg | Freq: Once | INTRAMUSCULAR | Status: DC | PRN

## 2022-08-24 MED ORDER — DIAZEPAM 5 MG PO TABS: 5.0000 mg | ORAL_TABLET | Freq: Once | ORAL | Status: DC

## 2022-08-24 NOTE — Discharge Instructions (Signed)

## 2022-08-27 NOTE — Progress Notes (Unsigned)
Referring Physician:  Danella Penton, MD [address] [facility] [address] [facility] [address] [facility] [address] [facility] [place],  [place] [address]  Primary Physician:  Danella Penton, MD  History of Present Illness: 08/28/2022 [name] is here today with a chief complaint of 9 months of left-sided leg pain that extends from his hip down the back of his leg down the back of his calf.  Standing and walking make his pain worse.  By the time he makes his coffee, he has severe pain that he has to sit down to help with.  He has some tingling and numbness in the back of his left calf.  He has tried physical therapy without improvement.  His pain is gotten worse over time.  He describes some numbness in both of his feet as well.  Bowel/Bladder Dysfunction: none  Conservative measures:  Physical therapy:  has participated in at [facility] from 11/29/21 to 01/15/22 Multimodal medical therapy including regular antiinflammatories:  tramadol, gabapentin, diclofenac, ibuprofen, methocarbamol Injections:  has received epidural steroid injections 05/30/2022: Left L5-S1 and S1 transforaminal ESI (dexamethasone 12 mg) 11/28/2021: Bilateral S1 transforaminal ESI (moderate relief) 05/25/2021: MBB to the bilateral L3-4 facet joints (6/10 to no relief) 07/29/2020: Right L4-5 and right S1 transforaminal ESI (dexamethasone 12 mg) 03/02/2020: Bilateral S1 transforaminal ESI (mild relief) 10/15/2019: RFA to the bilateral L4-5 and L5-S1 facet joints (moderate to good relief x4 to 5 weeks) 09/01/2019: MBB to the bilateral L4-5 and L5-S1 facet joints (4-5/10 to 0/10 times approximately 2 to 3 hours) 08/14/2019: MBB to the bilateral L4-5 and L5-S1 facet joints (5/10 to 0/10 times approximately 2 to 3 hours) 11/07/2018: Bilateral L5-S1 transforaminal ESI 05/02/2018: Bilateral S1 transforaminal ESI (less effective) 04/04/2018: Bilateral S1 transforaminal ESI (mild to moderate relief) 06/17/2015: Bilateral S1 transforaminal  ESI (moderate relief)   Past Surgery:  L4-5 Fusion in 2022 by Dr. Sherre Scarlet has no symptoms of cervical myelopathy.  The symptoms are causing a significant impact on the patient's life.   I have utilized the care everywhere function in epic to review the outside records available from external health systems.  Review of Systems:  A 10 point review of systems is negative, except for the pertinent positives and negatives detailed in the HPI.  Past Medical History: Past Medical History:  Diagnosis Date   Arthritis    GERD (gastroesophageal reflux disease)    Hyperlipidemia    Neuropathy    feet   Psoriasis    TB (tuberculosis) contact    Father had TB when Jonus was a child.  Tine tests show (+).  X-rays are clear.   Wears contact lenses     Past Surgical History: Past Surgical History:  Procedure Laterality Date   APPENDECTOMY     COLONOSCOPY WITH PROPOFOL N/A 03/06/2021   Procedure: COLONOSCOPY WITH PROPOFOL;  Surgeon: Midge Minium, MD;  Location: [facility];  Service: Endoscopy;  Laterality: N/A;   ESOPHAGOGASTRODUODENOSCOPY (EGD) WITH PROPOFOL N/A 03/06/2021   Procedure: ESOPHAGOGASTRODUODENOSCOPY WITH BIOPSY;  Surgeon: Midge Minium, MD;  Location: [facility];  Service: Endoscopy;  Laterality: N/A;   LEG SURGERY Right    TONSILLECTOMY      Allergies: Allergies as of 08/28/2022 - Review Complete 08/28/2022  Allergen Reaction Noted   Celebrex [celecoxib] Itching 01/30/2013   Iodine Other (See Comments) 08/02/2020   Kiwi extract Itching 09/04/2016   Meloxicam Swelling 07/14/2020   Shellfish allergy Hives and Itching 01/30/2013    Medications:  Current Outpatient Medications:    Adalimumab (HUMIRA) 40 MG/0.4ML PSKT, Inject 40 mg into the skin every 14 (fourteen) days. For maintenance., Disp: 2 each, Rfl: 2   Ascorbic Acid (VITAMIN C) 1000 MG tablet, Take 1,000 mg by mouth daily., Disp: , Rfl:    ascorbic Acid (VITAMIN C) 500 MG  CPCR, , Disp: , Rfl:    cholecalciferol (VITAMIN D3) 25 MCG (1000 UNIT) tablet, Take 1,000 Units by mouth daily., Disp: , Rfl:    clobetasol (TEMOVATE) 0.05 % external solution, Apply 1 Application topically as directed. Qd up to 5 days a week to affected area of psoriasis scalp as needed for flares, Disp: 50 mL, Rfl: 6   Coenzyme Q10 (COQ10) 100 MG CAPS, Take 100 mg by mouth every other day. Takes with Pravastatin, Disp: , Rfl:    Cyanocobalamin (VITAMIN B12) 3000 MCG/ML LIQD, , Disp: , Rfl:    diclofenac (VOLTAREN) 75 MG EC tablet, , Disp: , Rfl:    ibuprofen (ADVIL) 600 MG tablet, Take by mouth., Disp: , Rfl:    Multiple Vitamins-Minerals (MULTIVITAMIN WITH MINERALS) tablet, Take 1 tablet by mouth daily., Disp: , Rfl:    omeprazole (PRILOSEC) 40 MG capsule, Take 40 mg by mouth daily., Disp: , Rfl:    polyethylene glycol (MIRALAX / GLYCOLAX) 17 g packet, Take 17 g by mouth daily as needed for moderate constipation., Disp: 14 each, Rfl: 0   pravastatin (PRAVACHOL) 20 MG tablet, Take 20 mg by mouth every other day., Disp: , Rfl:    senna-docusate (SENOKOT-S) 8.6-50 MG tablet, Take 2 tablets by mouth at bedtime., Disp: 60 tablet, Rfl: 0   Tapinarof (VTAMA) 1 % CREA, Apply 1 application topically daily. Qd to aa psoriasis on body, Disp: 60 g, Rfl: 3  Social History: Social History   Tobacco Use   Smoking status: Never   Smokeless tobacco: Never  Vaping Use   Vaping Use: Never used  Substance Use Topics   Alcohol use: No   Drug use: No    Family Medical History: Family History  Problem Relation Age of Onset   Cancer Mother    Heart disease Father    Psoriasis Father     Physical Examination: Vitals:   08/28/22 0845  BP: (!) 140/82    General: Patient is in no apparent distress. Attention to examination is appropriate.  Neck:   Supple.  Full range of motion.  Respiratory: Patient is breathing without any difficulty.   NEUROLOGICAL:     Awake, alert, oriented to person,  place, and time.  Speech is clear and fluent.   Cranial Nerves: Pupils equal round and reactive to light.  Facial tone is symmetric.  Facial sensation is symmetric. Shoulder shrug is symmetric. Tongue protrusion is midline.  There is no pronator drift.  Strength: Side Biceps Triceps Deltoid Interossei Grip Wrist Ext. Wrist Flex.  R 5 5 5 5 5 5 5   L 5 5 5 5 5 5 5    Side Iliopsoas Quads Hamstring PF DF EHL  R 5 5 5 5 5 5   L 5 5 5 5 5 5    Reflexes are 1+ and symmetric at the biceps, triceps, brachioradialis, patella and achilles.   Hoffman's is absent.   Bilateral upper and lower extremity sensation is intact to light touch.    No evidence of dysmetria noted.  Gait is antalgic.  Straight leg raise is positive at 30 degrees on the left.     Medical Decision Making  Imaging:  CT Myelo 08/24/2022  L5-S1: Left eccentric disc bulging, a shallow left paracentral disc protrusion, endplate spurring, and mild facet hypertrophy result in mild left lateral recess stenosis and mild right and moderate left neural foraminal stenosis with potential left L5 nerve root impingement, unchanged. No spinal stenosis.  IMPRESSION: 1. Solid L4-5 fusion without residual stenosis. 2. Unchanged mild left lateral recess and moderate left neural foraminal stenosis at L5-S1. 3. Unchanged mild spinal stenosis and mild bilateral neural foraminal stenosis at L3-4. 4.  Aortic Atherosclerosis (ICD10-I70.0).     Electronically Signed   By: Sebastian Ache M.D.   On: 08/24/2022 14:30  MRI L spine 07/15/2022 L5-S1: Broad disc bulge with superimposed left paracentral/subarticular disc protrusion which contacts and mildly displaces descending left S1 nerve roots, similar. No significant central canal stenosis. Mild left foraminal stenosis appears similar. No significant right foraminal stenosis.  IMPRESSION: 1. At L5-S1, similar broad disc bulge with small left paracentral disc protrusion that contacts and  mildly displaces descending left S1 nerve root. Similar mild left foraminal stenosis at this level. 2. Anterior and posterior L4-L5 fusion with patent canal and foramina at this level.     Electronically Signed   By: Feliberto Harts M.D.   On: 07/23/2022 13:15   I have personally reviewed the images and agree with the above interpretation.  Assessment and Plan: Mr. Miyahira is a pleasant 67 y.o. 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  We discussed the options.  He has tried and failed conservative management, physical therapy, medications, and injections.  At this point, no further conservative management is indicated.  I think he has 3 options.  The first is spinal cord stimulation.  He is not currently interested at this time.  Second is left L5-S1 microdiscectomy and decompression of the left S1 nerve root.  There is extension of his fusion to S1.  I have recommended a left L5-S1 microdiscectomy.  I discussed the planned procedure at length with the patient, including the risks, benefits, alternatives, and indications. The risks discussed include but are not limited to bleeding, infection, need for reoperation, spinal fluid leak, stroke, vision loss, anesthetic complication, coma, paralysis, and even death. I also described in detail that improvement was not guaranteed.  The patient expressed understanding of these risks, and asked that we proceed with surgery. I described the surgery in layman's terms, and gave ample opportunity for questions, which were answered to the best of my ability.  I spent a total of 30 minutes in this patient's care today. This time was spent reviewing pertinent records including imaging studies, obtaining and confirming history, performing a directed evaluation, formulating and discussing my  recommendations, and documenting the visit within the medical record.     Thank you for involving me in the care of this patient.      Vici Novick K. Myer Haff MD, [facility]

## 2022-08-27 NOTE — H&P (View-Only) (Signed)
Referring Physician:  Danella Penton, MD [address] [facility] [address] [facility] [place],  [place] [address]  Primary Physician:  Danella Penton, MD  History of Present Illness: 08/28/2022 [name] is here today with a chief complaint of 9 months of left-sided leg pain that extends from his hip down the back of his leg down the back of his calf.  Standing and walking make his pain worse.  By the time he makes his coffee, he has severe pain that he has to sit down to help with.  He has some tingling and numbness in the back of his left calf.  He has tried physical therapy without improvement.  His pain is gotten worse over time.  He describes some numbness in both of his feet as well.  Bowel/Bladder Dysfunction: none  Conservative measures:  Physical therapy:  has participated in at [facility] from 11/29/21 to 01/15/22 Multimodal medical therapy including regular antiinflammatories:  tramadol, gabapentin, diclofenac, ibuprofen, methocarbamol Injections:  has received epidural steroid injections 05/30/2022: Left L5-S1 and S1 transforaminal ESI (dexamethasone 12 mg) 11/28/2021: Bilateral S1 transforaminal ESI (moderate relief) 05/25/2021: MBB to the bilateral L3-4 facet joints (6/10 to no relief) 07/29/2020: Right L4-5 and right S1 transforaminal ESI (dexamethasone 12 mg) 03/02/2020: Bilateral S1 transforaminal ESI (mild relief) 10/15/2019: RFA to the bilateral L4-5 and L5-S1 facet joints (moderate to good relief x4 to 5 weeks) 09/01/2019: MBB to the bilateral L4-5 and L5-S1 facet joints (4-5/10 to 0/10 times approximately 2 to 3 hours) 08/14/2019: MBB to the bilateral L4-5 and L5-S1 facet joints (5/10 to 0/10 times approximately 2 to 3 hours) 11/07/2018: Bilateral L5-S1 transforaminal ESI 05/02/2018: Bilateral S1 transforaminal ESI (less effective) 04/04/2018: Bilateral S1 transforaminal ESI (mild to moderate relief) 06/17/2015: Bilateral S1 transforaminal  ESI (moderate relief)   Past Surgery:  L4-5 Fusion in 2022 by Dr. Sherre Scarlet has no symptoms of cervical myelopathy.  The symptoms are causing a significant impact on the patient's life.   I have utilized the care everywhere function in epic to review the outside records available from external health systems.  Review of Systems:  A 10 point review of systems is negative, except for the pertinent positives and negatives detailed in the HPI.  Past Medical History: Past Medical History:  Diagnosis Date   Arthritis    GERD (gastroesophageal reflux disease)    Hyperlipidemia    Neuropathy    feet   Psoriasis    TB (tuberculosis) contact    Father had TB when Larry Russell was a child.  Tine tests show (+).  X-rays are clear.   Wears contact lenses     Past Surgical History: Past Surgical History:  Procedure Laterality Date   APPENDECTOMY     COLONOSCOPY WITH PROPOFOL N/A 03/06/2021   Procedure: COLONOSCOPY WITH PROPOFOL;  Surgeon: Midge Minium, MD;  Location: [facility];  Service: Endoscopy;  Laterality: N/A;   ESOPHAGOGASTRODUODENOSCOPY (EGD) WITH PROPOFOL N/A 03/06/2021   Procedure: ESOPHAGOGASTRODUODENOSCOPY WITH BIOPSY;  Surgeon: Midge Minium, MD;  Location: [facility];  Service: Endoscopy;  Laterality: N/A;   LEG SURGERY Right    TONSILLECTOMY      Allergies: Allergies as of 08/28/2022 - Review Complete 08/28/2022  Allergen Reaction Noted   Celebrex [celecoxib] Itching 01/30/2013   Iodine Other (See Comments) 08/02/2020   Kiwi extract Itching 09/04/2016   Meloxicam Swelling 07/14/2020   Shellfish allergy Hives and Itching 01/30/2013    Medications:  Current Outpatient Medications:    Adalimumab (HUMIRA) 40 MG/0.4ML PSKT, Inject 40 mg into the skin every 14 (fourteen) days. For maintenance., Disp: 2 each, Rfl: 2   Ascorbic Acid (VITAMIN C) 1000 MG tablet, Take 1,000 mg by mouth daily., Disp: , Rfl:    ascorbic Acid (VITAMIN C) 500 MG  CPCR, , Disp: , Rfl:    cholecalciferol (VITAMIN D3) 25 MCG (1000 UNIT) tablet, Take 1,000 Units by mouth daily., Disp: , Rfl:    clobetasol (TEMOVATE) 0.05 % external solution, Apply 1 Application topically as directed. Qd up to 5 days a week to affected area of psoriasis scalp as needed for flares, Disp: 50 mL, Rfl: 6   Coenzyme Q10 (COQ10) 100 MG CAPS, Take 100 mg by mouth every other day. Takes with Pravastatin, Disp: , Rfl:    Cyanocobalamin (VITAMIN B12) 3000 MCG/ML LIQD, , Disp: , Rfl:    diclofenac (VOLTAREN) 75 MG EC tablet, , Disp: , Rfl:    ibuprofen (ADVIL) 600 MG tablet, Take by mouth., Disp: , Rfl:    Multiple Vitamins-Minerals (MULTIVITAMIN WITH MINERALS) tablet, Take 1 tablet by mouth daily., Disp: , Rfl:    omeprazole (PRILOSEC) 40 MG capsule, Take 40 mg by mouth daily., Disp: , Rfl:    polyethylene glycol (MIRALAX / GLYCOLAX) 17 g packet, Take 17 g by mouth daily as needed for moderate constipation., Disp: 14 each, Rfl: 0   pravastatin (PRAVACHOL) 20 MG tablet, Take 20 mg by mouth every other day., Disp: , Rfl:    senna-docusate (SENOKOT-S) 8.6-50 MG tablet, Take 2 tablets by mouth at bedtime., Disp: 60 tablet, Rfl: 0   Tapinarof (VTAMA) 1 % CREA, Apply 1 application topically daily. Qd to aa psoriasis on body, Disp: 60 g, Rfl: 3  Social History: Social History   Tobacco Use   Smoking status: Never   Smokeless tobacco: Never  Vaping Use   Vaping Use: Never used  Substance Use Topics   Alcohol use: No   Drug use: No    Family Medical History: Family History  Problem Relation Age of Onset   Cancer Mother    Heart disease Father    Psoriasis Father     Physical Examination: Vitals:   08/28/22 0845  BP: (!) 140/82    General: Patient is in no apparent distress. Attention to examination is appropriate.  Neck:   Supple.  Full range of motion.  Respiratory: Patient is breathing without any difficulty.   NEUROLOGICAL:     Awake, alert, oriented to person,  place, and time.  Speech is clear and fluent.   Cranial Nerves: Pupils equal round and reactive to light.  Facial tone is symmetric.  Facial sensation is symmetric. Shoulder shrug is symmetric. Tongue protrusion is midline.  There is no pronator drift.  Strength: Side Biceps Triceps Deltoid Interossei Grip Wrist Ext. Wrist Flex.  R 5 5 5 5 5 5 5   L 5 5 5 5 5 5 5    Side Iliopsoas Quads Hamstring PF DF EHL  R 5 5 5 5 5 5   L 5 5 5 5 5 5    Reflexes are 1+ and symmetric at the biceps, triceps, brachioradialis, patella and achilles.   Hoffman's is absent.   Bilateral upper and lower extremity sensation is intact to light touch.    No evidence of dysmetria noted.  Gait is antalgic.  Straight leg raise is positive at 30 degrees on the left.     Medical Decision Making  Imaging:  CT Myelo 08/24/2022  L5-S1: Left eccentric disc bulging, a shallow left paracentral disc protrusion, endplate spurring, and mild facet hypertrophy result in mild left lateral recess stenosis and mild right and moderate left neural foraminal stenosis with potential left L5 nerve root impingement, unchanged. No spinal stenosis.  IMPRESSION: 1. Solid L4-5 fusion without residual stenosis. 2. Unchanged mild left lateral recess and moderate left neural foraminal stenosis at L5-S1. 3. Unchanged mild spinal stenosis and mild bilateral neural foraminal stenosis at L3-4. 4.  Aortic Atherosclerosis (ICD10-I70.0).     Electronically Signed   By: Sebastian Ache M.D.   On: 08/24/2022 14:30  MRI L spine 07/15/2022 L5-S1: Broad disc bulge with superimposed left paracentral/subarticular disc protrusion which contacts and mildly displaces descending left S1 nerve roots, similar. No significant central canal stenosis. Mild left foraminal stenosis appears similar. No significant right foraminal stenosis.  IMPRESSION: 1. At L5-S1, similar broad disc bulge with small left paracentral disc protrusion that contacts and  mildly displaces descending left S1 nerve root. Similar mild left foraminal stenosis at this level. 2. Anterior and posterior L4-L5 fusion with patent canal and foramina at this level.     Electronically Signed   By: Feliberto Harts M.D.   On: 07/23/2022 13:15   I have personally reviewed the images and agree with the above interpretation.  Assessment and Plan: Mr. Miyahira is a pleasant 67 y.o. 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  We discussed the options.  He has tried and failed conservative management, physical therapy, medications, and injections.  At this point, no further conservative management is indicated.  I think he has 3 options.  The first is spinal cord stimulation.  He is not currently interested at this time.  Second is left L5-S1 microdiscectomy and decompression of the left S1 nerve root.  There is extension of his fusion to S1.  I have recommended a left L5-S1 microdiscectomy.  I discussed the planned procedure at length with the patient, including the risks, benefits, alternatives, and indications. The risks discussed include but are not limited to bleeding, infection, need for reoperation, spinal fluid leak, stroke, vision loss, anesthetic complication, coma, paralysis, and even death. I also described in detail that improvement was not guaranteed.  The patient expressed understanding of these risks, and asked that we proceed with surgery. I described the surgery in layman's terms, and gave ample opportunity for questions, which were answered to the best of my ability.  I spent a total of 30 minutes in this patient's care today. This time was spent reviewing pertinent records including imaging studies, obtaining and confirming history, performing a directed evaluation, formulating and discussing my  recommendations, and documenting the visit within the medical record.     Thank you for involving me in the care of this patient.      Chester K. Myer Haff MD, [facility]

## 2022-08-28 ENCOUNTER — Ambulatory Visit (INDEPENDENT_AMBULATORY_CARE_PROVIDER_SITE_OTHER): Payer: Medicare HMO | Admitting: Neurosurgery

## 2022-08-28 ENCOUNTER — Other Ambulatory Visit: Payer: Self-pay

## 2022-08-28 ENCOUNTER — Encounter: Payer: Self-pay | Admitting: Neurosurgery

## 2022-08-28 VITALS — BP 140/82 | Ht 72.0 in | Wt 203.6 lb

## 2022-08-28 DIAGNOSIS — M5117 Intervertebral disc disorders with radiculopathy, lumbosacral region: Secondary | ICD-10-CM | POA: Diagnosis not present

## 2022-08-28 DIAGNOSIS — M5416 Radiculopathy, lumbar region: Secondary | ICD-10-CM

## 2022-08-28 DIAGNOSIS — Z01818 Encounter for other preprocedural examination: Secondary | ICD-10-CM

## 2022-08-28 NOTE — Patient Instructions (Signed)
Please see below for information in regards to your upcoming surgery:   Planned surgery: Left L5-S1 microdiscectomy   Surgery date: 09/17/22 at [facility] - you will find out your arrival time the business day before your surgery.   Pre-op appointment at [facility] Pre-admit Testing: we will call you with a date/time for this. Pre-admit testing is located on the first floor of the [facility] building, [address] [facility] [address]. Please bring all prescriptions in the original prescription bottles to your appointment, even if you have reviewed medications by phone with a pharmacy representative. During this appointment, they will advise you which medications you can take the morning of surgery, and which medications you will need to hold for surgery. Pre-op labs may be done at your pre-op appointment. You are not required to fast for these labs. Should you need to change your pre-op appointment, please call Pre-admit testing at [phone].    Surgical clearance: we will send a clearance form to Dr Hyacinth Meeker    Humira: last dose was 08/26/22: hold until 3 weeks after surgery    Common restrictions after surgery: No bending, lifting, or twisting ("BLT"). Avoid lifting objects heavier than 10 pounds for the first 6 weeks after surgery. Where possible, avoid household activities that involve lifting, bending, reaching, pushing, or pulling such as laundry, vacuuming, grocery shopping, and childcare. Try to arrange for help from friends and family for these activities while your back heals. Do not drive while taking prescription pain medication. Weeks 6 through 12 after surgery: avoid lifting more than 25 pounds.     How to contact us:  If you have any questions/concerns before or after surgery, you can reach [place] at [phone], or you can send a mychart message. We can be reached by phone or mychart 8am-4pm, Monday-Friday.  *Please note: Calls after 4pm are  forwarded to a third party answering service. Mychart messages are not routinely monitored during evenings, weekends, and holidays. Please call our office to contact the answering service for urgent concerns during non-business hours.    Appointments/FMLA & disability paperwork: Patty [employer] Cristin  Nurse: Royston Cowper  Medical assistants: Laurann Montana Physician Assistant's: Manning Charity & Drake Leach Surgeons: Venetia Night, MD & Ernestine Mcmurray, MD

## 2022-09-05 ENCOUNTER — Encounter
Admission: RE | Admit: 2022-09-05 | Discharge: 2022-09-05 | Disposition: A | Discharge status: Home / Self Care | Payer: Medicare HMO | Source: Ambulatory Visit | Attending: Neurosurgery | Admitting: Neurosurgery

## 2022-09-05 ENCOUNTER — Other Ambulatory Visit: Payer: Self-pay

## 2022-09-05 VITALS — BP 144/93 | HR 101 | Resp 16 | Wt 201.5 lb

## 2022-09-05 DIAGNOSIS — E782 Mixed hyperlipidemia: Secondary | ICD-10-CM | POA: Diagnosis not present

## 2022-09-05 DIAGNOSIS — Z01818 Encounter for other preprocedural examination: Secondary | ICD-10-CM | POA: Insufficient documentation

## 2022-09-05 DIAGNOSIS — Z01812 Encounter for preprocedural laboratory examination: Secondary | ICD-10-CM

## 2022-09-05 DIAGNOSIS — E663 Overweight: Secondary | ICD-10-CM

## 2022-09-05 DIAGNOSIS — Z01810 Encounter for preprocedural cardiovascular examination: Secondary | ICD-10-CM | POA: Diagnosis not present

## 2022-09-05 HISTORY — DX: Barrett's esophagus with dysplasia, unspecified: K22.719

## 2022-09-05 HISTORY — DX: Radiculopathy, lumbar region: M54.16

## 2022-09-05 HISTORY — DX: Other enthesopathy of right foot and ankle: M77.51

## 2022-09-05 HISTORY — DX: Other psoriatic arthropathy: L40.59

## 2022-09-05 HISTORY — DX: Hallux valgus (acquired), right foot: M20.11

## 2022-09-05 HISTORY — DX: Unspecified inflammatory spondylopathy, site unspecified: M46.90

## 2022-09-05 HISTORY — DX: Hereditary and idiopathic neuropathy, unspecified: G60.9

## 2022-09-05 HISTORY — DX: Intervertebral disc disorders with radiculopathy, lumbar region: M51.16

## 2022-09-05 HISTORY — DX: Hallux valgus (acquired), left foot: M20.12

## 2022-09-05 HISTORY — DX: Spondylolisthesis, lumbar region: M43.16

## 2022-09-05 HISTORY — DX: Other acquired deformities of unspecified foot: M21.6X9

## 2022-09-05 HISTORY — DX: Unspecified osteoarthritis, unspecified site: M19.90

## 2022-09-05 HISTORY — DX: Deficiency of other specified B group vitamins: E53.8

## 2022-09-05 HISTORY — DX: Pain in unspecified joint: M25.50

## 2022-09-05 LAB — TYPE AND SCREEN
ABO/RH(D): A NEG
Antibody Screen: NEGATIVE

## 2022-09-05 LAB — CBC
HCT: 41.5 % (ref 39.0–52.0)
Hemoglobin: 14.5 g/dL (ref 13.0–17.0)
MCH: 32.8 pg (ref 26.0–34.0)
MCHC: 34.9 g/dL (ref 30.0–36.0)
MCV: 93.9 fL (ref 80.0–100.0)
Platelets: 226 10*3/uL (ref 150–400)
RBC: 4.42 MIL/uL (ref 4.22–5.81)
RDW: 12.4 % (ref 11.5–15.5)
WBC: 6.6 10*3/uL (ref 4.0–10.5)
nRBC: 0 % (ref 0.0–0.2)

## 2022-09-05 LAB — SURGICAL PCR SCREEN
MRSA, PCR: NEGATIVE
Staphylococcus aureus: NEGATIVE

## 2022-09-05 LAB — URINALYSIS, ROUTINE W REFLEX MICROSCOPIC
Bilirubin Urine: NEGATIVE
Glucose, UA: NEGATIVE mg/dL
Hgb urine dipstick: NEGATIVE
Ketones, ur: 5 mg/dL — AB
Leukocytes,Ua: NEGATIVE
Nitrite: NEGATIVE
Protein, ur: NEGATIVE mg/dL
Specific Gravity, Urine: 1.021 (ref 1.005–1.030)
pH: 5 (ref 5.0–8.0)

## 2022-09-05 LAB — BASIC METABOLIC PANEL
Anion gap: 9 (ref 5–15)
BUN: 15 mg/dL (ref 8–23)
CO2: 21 mmol/L — ABNORMAL LOW (ref 22–32)
Calcium: 9.2 mg/dL (ref 8.9–10.3)
Chloride: 107 mmol/L (ref 98–111)
Creatinine, Ser: 0.91 mg/dL (ref 0.61–1.24)
GFR, Estimated: >60 mL/min (ref >60)
Glucose, Bld: 111 mg/dL — ABNORMAL HIGH (ref 70–99)
Potassium: 3.3 mmol/L — ABNORMAL LOW (ref 3.5–5.1)
Sodium: 137 mmol/L (ref 135–145)

## 2022-09-05 NOTE — Patient Instructions (Addendum)
Your procedure is scheduled on: 09/17/22 - Monday Report to the Registration Desk on the 1st floor of the [facility]. To find out your arrival time, please call [phone] between 1PM - 3PM on: 09/14/22 - Friday If your arrival time is 6:00 am, do not arrive before that time as the [facility] entrance doors do not open until 6:00 am.  REMEMBER: Instructions that are not followed completely may result in serious medical risk, up to and including death; or upon the discretion of your surgeon and anesthesiologist your surgery may need to be rescheduled.  Do not eat food after midnight the night before surgery.  No gum chewing or hard candies.  You may however, drink CLEAR liquids up to 2 hours before you are scheduled to arrive for your surgery. Do not drink anything within 2 hours of your scheduled arrival time.  Clear liquids include: - water  - apple juice without pulp - gatorade (not RED colors) - black coffee or tea (Do NOT add milk or creamers to the coffee or tea) Do NOT drink anything that is not on this list.   You may continue Anti-inflammatories (NSAIDS) such as Advil, Aleve, Ibuprofen, Motrin, Naproxen, Naprosyn and Aspirin based products such as Excedrin, Goody's Powder, BC Powder.   You may take Tylenol if needed for pain up until the day of surgery.  Stop on 09/10/22, ANY OVER THE COUNTER supplements until after surgery.  Continue taking all prescribed medications with the exception of the following:  You have been instructed to hold your  Humira until 3 weeks after surgery    TAKE ONLY THESE MEDICATIONS THE MORNING OF SURGERY WITH A SIP OF WATER:  omeprazole (PRILOSEC) - (take one the night before and one on the morning of surgery - helps to prevent nausea after surgery.)   No Alcohol for 24 hours before or after surgery.  No Smoking including e-cigarettes for 24 hours before surgery.  No chewable tobacco products for at least 6 hours before surgery.  No  nicotine patches on the day of surgery.  Do not use any "recreational" drugs for at least a week (preferably 2 weeks) before your surgery.  Please be advised that the combination of cocaine and anesthesia may have negative outcomes, up to and including death. If you test positive for cocaine, your surgery will be cancelled.  On the morning of surgery brush your teeth with toothpaste and water, you may rinse your mouth with mouthwash if you wish. Do not swallow any toothpaste or mouthwash.  Use CHG Soap or wipes as directed on instruction sheet.  Do not wear jewelry, make-up, hairpins, clips or nail polish.  Do not wear lotions, powders, or perfumes.   Do not shave body hair from the neck down 48 hours before surgery.  Contact lenses, hearing aids and dentures may not be worn into surgery.  Do not bring valuables to the hospital. [facility] is not responsible for any missing/lost belongings or valuables.   Notify your doctor if there is any change in your medical condition (cold, fever, infection).  Wear comfortable clothing (specific to your surgery type) to the hospital.  After surgery, you can help prevent lung complications by doing breathing exercises.  Take deep breaths and cough every 1-2 hours. Your doctor may order a device called an Incentive Spirometer to help you take deep breaths. When coughing or sneezing, hold a pillow firmly against your incision with both hands. This is called "splinting." Doing this helps protect  your incision. It also decreases belly discomfort.  If you are being admitted to the hospital overnight, leave your suitcase in the car. After surgery it may be brought to your room.  In case of increased patient census, it may be necessary for you, the patient, to continue your postoperative care in the Same Day Surgery department.  If you are being discharged the day of surgery, you will not be allowed to drive home. You will need a responsible individual  to drive you home and stay with you for 24 hours after surgery.   If you are taking public transportation, you will need to have a responsible individual with you.  Please call the Pre-admissions Testing Dept. at [phone] if you have any questions about these instructions.  Surgery Visitation Policy:  Patients having surgery or a procedure may have two visitors.  Children under the age of 35 must have an adult with them who is not the patient.  Inpatient Visitation:    Visiting hours are 7 a.m. to 8 p.m. Up to four visitors are allowed at one time in a patient room. The visitors may rotate out with other people during the day.  One visitor age 45 or older may stay with the patient overnight and must be in the room by 8 p.m.   Pre-operative 5 CHG Bath Instructions   You can play a key role in reducing the risk of infection after surgery. Your skin needs to be as free of germs as possible. You can reduce the number of germs on your skin by washing with CHG (chlorhexidine gluconate) soap before surgery. CHG is an antiseptic soap that kills germs and continues to kill germs even after washing.   DO NOT use if you have an allergy to chlorhexidine/CHG or antibacterial soaps. If your skin becomes reddened or irritated, stop using the CHG and notify one of our RNs at [phone].   Please shower with the CHG soap starting 4 days before surgery using the following schedule: 07/25 - 07/29.    Please keep in mind the following:  DO NOT shave, including legs and underarms, starting the day of your first shower.   You may shave your face at any point before/day of surgery.  Place clean sheets on your bed the day you start using CHG soap. Use a clean washcloth (not used since being washed) for each shower. DO NOT sleep with pets once you start using the CHG.   CHG Shower Instructions:  If you choose to wash your hair and private area, wash first with your normal shampoo/soap.  After you  use shampoo/soap, rinse your hair and body thoroughly to remove shampoo/soap residue.  Turn the water OFF and apply about 3 tablespoons (45 ml) of CHG soap to a CLEAN washcloth.  Apply CHG soap ONLY FROM YOUR NECK DOWN TO YOUR TOES (washing for 3-5 minutes)  DO NOT use CHG soap on face, private areas, open wounds, or sores.  Pay special attention to the area where your surgery is being performed.  If you are having back surgery, having someone wash your back for you may be helpful. Wait 2 minutes after CHG soap is applied, then you may rinse off the CHG soap.  Pat dry with a clean towel  Put on clean clothes/pajamas   If you choose to wear lotion, please use ONLY the CHG-compatible lotions on the back of this paper.     Additional instructions for the day of surgery: DO NOT  APPLY any lotions, deodorants, cologne, or perfumes.   Put on clean/comfortable clothes.  Brush your teeth.  Ask your nurse before applying any prescription medications to the skin.      CHG Compatible Lotions   Aveeno Moisturizing lotion  Cetaphil Moisturizing Cream  Cetaphil Moisturizing Lotion  Clairol Herbal Essence Moisturizing Lotion, Dry Skin  Clairol Herbal Essence Moisturizing Lotion, Extra Dry Skin  Clairol Herbal Essence Moisturizing Lotion, Normal Skin  Curel Age Defying Therapeutic Moisturizing Lotion with Alpha Hydroxy  Curel Extreme Care Body Lotion  Curel Soothing Hands Moisturizing Hand Lotion  Curel Therapeutic Moisturizing Cream, Fragrance-Free  Curel Therapeutic Moisturizing Lotion, Fragrance-Free  Curel Therapeutic Moisturizing Lotion, Original Formula  Eucerin Daily Replenishing Lotion  Eucerin Dry Skin Therapy Plus Alpha Hydroxy Crme  Eucerin Dry Skin Therapy Plus Alpha Hydroxy Lotion  Eucerin Original Crme  Eucerin Original Lotion  Eucerin Plus Crme Eucerin Plus Lotion  Eucerin TriLipid Replenishing Lotion  Keri Anti-Bacterial Hand Lotion  Keri Deep Conditioning Original Lotion  Dry Skin Formula Softly Scented  Keri Deep Conditioning Original Lotion, Fragrance Free Sensitive Skin Formula  Keri Lotion Fast Absorbing Fragrance Free Sensitive Skin Formula  Keri Lotion Fast Absorbing Softly Scented Dry Skin Formula  Keri Original Lotion  Keri Skin Renewal Lotion Keri Silky Smooth Lotion  Keri Silky Smooth Sensitive Skin Lotion  Nivea Body Creamy Conditioning Oil  Nivea Body Extra Enriched Teacher, adult education Moisturizing Lotion Nivea Crme  Nivea Skin Firming Lotion  NutraDerm 30 Skin Lotion  NutraDerm Skin Lotion  NutraDerm Therapeutic Skin Cream  NutraDerm Therapeutic Skin Lotion  ProShield Protective Hand Cream  Provon moisturizing lotion

## 2022-09-10 ENCOUNTER — Other Ambulatory Visit: Payer: Medicare HMO

## 2022-09-17 ENCOUNTER — Encounter
Admission: RE | Disposition: A | Discharge status: Home / Self Care | Payer: Self-pay | Source: Ambulatory Visit | Attending: Neurosurgery

## 2022-09-17 ENCOUNTER — Ambulatory Visit: Payer: Medicare HMO

## 2022-09-17 ENCOUNTER — Other Ambulatory Visit: Payer: Self-pay

## 2022-09-17 ENCOUNTER — Telehealth: Payer: Self-pay | Admitting: Neurosurgery

## 2022-09-17 ENCOUNTER — Encounter: Payer: Self-pay | Admitting: Neurosurgery

## 2022-09-17 ENCOUNTER — Ambulatory Visit: Payer: Medicare HMO | Admitting: Urgent Care

## 2022-09-17 ENCOUNTER — Ambulatory Visit
Admission: RE | Admit: 2022-09-17 | Discharge: 2022-09-17 | Disposition: A | Discharge status: Home / Self Care | Payer: Medicare HMO | Source: Ambulatory Visit | Attending: Neurosurgery | Admitting: Neurosurgery

## 2022-09-17 DIAGNOSIS — Z01818 Encounter for other preprocedural examination: Secondary | ICD-10-CM

## 2022-09-17 DIAGNOSIS — K219 Gastro-esophageal reflux disease without esophagitis: Secondary | ICD-10-CM | POA: Diagnosis not present

## 2022-09-17 DIAGNOSIS — I10 Essential (primary) hypertension: Secondary | ICD-10-CM | POA: Diagnosis not present

## 2022-09-17 DIAGNOSIS — M5117 Intervertebral disc disorders with radiculopathy, lumbosacral region: Secondary | ICD-10-CM | POA: Insufficient documentation

## 2022-09-17 DIAGNOSIS — M5416 Radiculopathy, lumbar region: Secondary | ICD-10-CM | POA: Diagnosis not present

## 2022-09-17 HISTORY — PX: LUMBAR LAMINECTOMY/DECOMPRESSION MICRODISCECTOMY: SHX5026

## 2022-09-17 SURGERY — LUMBAR LAMINECTOMY/DECOMPRESSION MICRODISCECTOMY 1 LEVEL
Anesthesia: General | Site: Spine Lumbar | Laterality: Left

## 2022-09-17 MED ORDER — OXYCODONE HCL 5 MG PO TABS
5.0000 mg | ORAL_TABLET | ORAL | 0 refills | Status: DC | PRN
Start: 2022-09-17 — End: 2022-10-05

## 2022-09-17 MED ORDER — ACETAMINOPHEN 10 MG/ML IV SOLN
INTRAVENOUS | Status: DC | PRN
  Administered 2022-09-17: 1000 mg via INTRAVENOUS

## 2022-09-17 MED ORDER — PROPOFOL 10 MG/ML IV BOLUS
INTRAVENOUS | Status: AC
  Filled 2022-09-17: qty 20

## 2022-09-17 MED ORDER — PHENYLEPHRINE HCL-NACL 20-0.9 MG/250ML-% IV SOLN
INTRAVENOUS | Status: DC | PRN
  Administered 2022-09-17: 20 ug/min via INTRAVENOUS

## 2022-09-17 MED ORDER — CHLORHEXIDINE GLUCONATE 0.12 % MT SOLN
15.0000 mL | Freq: Once | OROMUCOSAL | Status: AC
  Administered 2022-09-17: 15 mL via OROMUCOSAL

## 2022-09-17 MED ORDER — ACETAMINOPHEN 10 MG/ML IV SOLN: 1000.0000 mg | Freq: Once | INTRAVENOUS | Status: DC | PRN

## 2022-09-17 MED ORDER — TIZANIDINE HCL 2 MG PO TABS: 2.0000 mg | ORAL_TABLET | Freq: Three times a day (TID) | ORAL | 0 refills | Status: DC | PRN

## 2022-09-17 MED ORDER — LACTATED RINGERS IV SOLN: INTRAVENOUS | Status: DC

## 2022-09-17 MED ORDER — MIDAZOLAM HCL 2 MG/2ML IJ SOLN
INTRAMUSCULAR | Status: AC
  Filled 2022-09-17: qty 2

## 2022-09-17 MED ORDER — CEFAZOLIN SODIUM-DEXTROSE 2-4 GM/100ML-% IV SOLN
INTRAVENOUS | Status: AC
  Filled 2022-09-17: qty 100

## 2022-09-17 MED ORDER — SURGIFLO WITH THROMBIN (HEMOSTATIC MATRIX KIT) OPTIME
TOPICAL | Status: DC | PRN
  Administered 2022-09-17: 1 via TOPICAL

## 2022-09-17 MED ORDER — REMIFENTANIL HCL 1 MG IV SOLR
INTRAVENOUS | Status: DC | PRN
  Administered 2022-09-17: .1 ug/kg/min via INTRAVENOUS

## 2022-09-17 MED ORDER — FENTANYL CITRATE (PF) 100 MCG/2ML IJ SOLN
INTRAMUSCULAR | Status: AC
  Filled 2022-09-17: qty 2

## 2022-09-17 MED ORDER — OXYCODONE HCL 5 MG/5ML PO SOLN: 5.0000 mg | Freq: Once | ORAL | Status: DC | PRN

## 2022-09-17 MED ORDER — BUPIVACAINE HCL (PF) 0.5 % IJ SOLN
INTRAMUSCULAR | Status: AC
  Filled 2022-09-17: qty 30

## 2022-09-17 MED ORDER — ACETAMINOPHEN 10 MG/ML IV SOLN
INTRAVENOUS | Status: AC
  Filled 2022-09-17: qty 100

## 2022-09-17 MED ORDER — FENTANYL CITRATE (PF) 100 MCG/2ML IJ SOLN
INTRAMUSCULAR | Status: DC | PRN
  Administered 2022-09-17: 50 ug via INTRAVENOUS

## 2022-09-17 MED ORDER — ONDANSETRON HCL 4 MG/2ML IJ SOLN
INTRAMUSCULAR | Status: AC
  Filled 2022-09-17: qty 2

## 2022-09-17 MED ORDER — DEXAMETHASONE SODIUM PHOSPHATE 10 MG/ML IJ SOLN
INTRAMUSCULAR | Status: DC | PRN
  Administered 2022-09-17: 10 mg via INTRAVENOUS

## 2022-09-17 MED ORDER — DEXAMETHASONE SODIUM PHOSPHATE 10 MG/ML IJ SOLN
INTRAMUSCULAR | Status: AC
  Filled 2022-09-17: qty 1

## 2022-09-17 MED ORDER — PROPOFOL 10 MG/ML IV BOLUS
INTRAVENOUS | Status: DC | PRN
  Administered 2022-09-17: 180 mg via INTRAVENOUS
  Administered 2022-09-17: 150 ug/kg/min via INTRAVENOUS

## 2022-09-17 MED ORDER — BUPIVACAINE-EPINEPHRINE (PF) 0.5% -1:200000 IJ SOLN
INTRAMUSCULAR | Status: DC | PRN
  Administered 2022-09-17: 4.5 mL via PERINEURAL

## 2022-09-17 MED ORDER — SUCCINYLCHOLINE CHLORIDE 200 MG/10ML IV SOSY
PREFILLED_SYRINGE | INTRAVENOUS | Status: DC | PRN
  Administered 2022-09-17: 100 mg via INTRAVENOUS

## 2022-09-17 MED ORDER — SUCCINYLCHOLINE CHLORIDE 200 MG/10ML IV SOSY
PREFILLED_SYRINGE | INTRAVENOUS | Status: AC
  Filled 2022-09-17: qty 10

## 2022-09-17 MED ORDER — PROPOFOL 1000 MG/100ML IV EMUL
INTRAVENOUS | Status: AC
  Filled 2022-09-17: qty 100

## 2022-09-17 MED ORDER — REMIFENTANIL HCL 1 MG IV SOLR
INTRAVENOUS | Status: AC
  Filled 2022-09-17: qty 1000

## 2022-09-17 MED ORDER — ONDANSETRON HCL 4 MG/2ML IJ SOLN: 4.0000 mg | Freq: Once | INTRAMUSCULAR | Status: DC | PRN

## 2022-09-17 MED ORDER — PHENYLEPHRINE HCL (PRESSORS) 10 MG/ML IV SOLN
INTRAVENOUS | Status: DC | PRN
  Administered 2022-09-17 (×2): 80 ug via INTRAVENOUS

## 2022-09-17 MED ORDER — MIDAZOLAM HCL 2 MG/2ML IJ SOLN
INTRAMUSCULAR | Status: DC | PRN
  Administered 2022-09-17: 2 mg via INTRAVENOUS

## 2022-09-17 MED ORDER — CEFAZOLIN SODIUM-DEXTROSE 2-4 GM/100ML-% IV SOLN
2.0000 g | Freq: Once | INTRAVENOUS | Status: AC
  Administered 2022-09-17: 2 g via INTRAVENOUS

## 2022-09-17 MED ORDER — METHYLPREDNISOLONE ACETATE 40 MG/ML IJ SUSP
INTRAMUSCULAR | Status: AC
  Filled 2022-09-17: qty 1

## 2022-09-17 MED ORDER — BUPIVACAINE LIPOSOME 1.3 % IJ SUSP
INTRAMUSCULAR | Status: AC
  Filled 2022-09-17: qty 20

## 2022-09-17 MED ORDER — FENTANYL CITRATE (PF) 100 MCG/2ML IJ SOLN: 25.0000 ug | INTRAMUSCULAR | Status: DC | PRN

## 2022-09-17 MED ORDER — LIDOCAINE HCL (CARDIAC) PF 100 MG/5ML IV SOSY
PREFILLED_SYRINGE | INTRAVENOUS | Status: DC | PRN
  Administered 2022-09-17: 100 mg via INTRAVENOUS

## 2022-09-17 MED ORDER — ONDANSETRON HCL 4 MG/2ML IJ SOLN
INTRAMUSCULAR | Status: DC | PRN
  Administered 2022-09-17: 4 mg via INTRAVENOUS

## 2022-09-17 MED ORDER — 0.9 % SODIUM CHLORIDE (POUR BTL) OPTIME
TOPICAL | Status: DC | PRN
  Administered 2022-09-17: 500 mL

## 2022-09-17 MED ORDER — CHLORHEXIDINE GLUCONATE 0.12 % MT SOLN
OROMUCOSAL | Status: AC
  Filled 2022-09-17: qty 15

## 2022-09-17 MED ORDER — ORAL CARE MOUTH RINSE: 15.0000 mL | Freq: Once | OROMUCOSAL | Status: AC

## 2022-09-17 MED ORDER — EPHEDRINE SULFATE-NACL 50-0.9 MG/10ML-% IV SOSY
PREFILLED_SYRINGE | INTRAVENOUS | Status: DC | PRN
  Administered 2022-09-17: 5 mg via INTRAVENOUS
  Administered 2022-09-17: 2.5 mg via INTRAVENOUS

## 2022-09-17 MED ORDER — BUPIVACAINE-EPINEPHRINE (PF) 0.5% -1:200000 IJ SOLN
INTRAMUSCULAR | Status: AC
  Filled 2022-09-17: qty 30

## 2022-09-17 MED ORDER — METHYLPREDNISOLONE ACETATE 40 MG/ML IJ SUSP
INTRAMUSCULAR | Status: DC | PRN
  Administered 2022-09-17: 40 mg

## 2022-09-17 MED ORDER — SODIUM CHLORIDE FLUSH 0.9 % IV SOLN
INTRAVENOUS | Status: AC
  Filled 2022-09-17: qty 20

## 2022-09-17 MED ORDER — SODIUM CHLORIDE (PF) 0.9 % IJ SOLN
INTRAMUSCULAR | Status: DC | PRN
  Administered 2022-09-17: 60 mL via INTRAMUSCULAR

## 2022-09-17 MED ORDER — OXYCODONE HCL 5 MG PO TABS: 5.0000 mg | ORAL_TABLET | Freq: Once | ORAL | Status: DC | PRN

## 2022-09-17 MED ORDER — LIDOCAINE HCL (PF) 2 % IJ SOLN
INTRAMUSCULAR | Status: AC
  Filled 2022-09-17: qty 5

## 2022-09-17 SURGICAL SUPPLY — 40 items
ADH SKN CLS APL DERMABOND .7 (GAUZE/BANDAGES/DRESSINGS) ×1
AGENT HMST KT MTR STRL THRMB (HEMOSTASIS) ×1
BASIN KIT SINGLE STR (MISCELLANEOUS) ×1 IMPLANT
BUR NEURO DRILL SOFT 3.0X3.8M (BURR) ×1 IMPLANT
CNTNR URN SCR LID CUP LEK RST (MISCELLANEOUS) ×1 IMPLANT
CONT SPEC 4OZ STRL OR WHT (MISCELLANEOUS)
DERMABOND ADVANCED .7 DNX12 (GAUZE/BANDAGES/DRESSINGS) ×1 IMPLANT
DRAPE C ARM PK CFD 31 SPINE (DRAPES) ×1 IMPLANT
DRAPE LAPAROTOMY 100X77 ABD (DRAPES) ×1 IMPLANT
DRAPE MICROSCOPE SPINE 48X150 (DRAPES) IMPLANT
DRSG OPSITE POSTOP 3X4 (GAUZE/BANDAGES/DRESSINGS) IMPLANT
ELECT EZSTD 165MM 6.5IN (MISCELLANEOUS) ×1
ELECT REM PT RETURN 9FT ADLT (ELECTROSURGICAL) ×1
ELECTRODE EZSTD 165MM 6.5IN (MISCELLANEOUS) ×1 IMPLANT
ELECTRODE REM PT RTRN 9FT ADLT (ELECTROSURGICAL) ×1 IMPLANT
GLOVE BIOGEL PI IND STRL 6.5 (GLOVE) ×1 IMPLANT
GLOVE SURG SYN 6.5 ES PF (GLOVE) ×1 IMPLANT
GLOVE SURG SYN 6.5 PF PI (GLOVE) ×1 IMPLANT
GLOVE SURG SYN 8.5 E (GLOVE) ×3 IMPLANT
GLOVE SURG SYN 8.5 PF PI (GLOVE) ×3 IMPLANT
GOWN SRG LRG LVL 4 IMPRV REINF (GOWNS) ×1 IMPLANT
GOWN SRG XL LVL 3 NONREINFORCE (GOWNS) ×1 IMPLANT
GOWN STRL NON-REIN TWL XL LVL3 (GOWNS) ×1
GOWN STRL REIN LRG LVL4 (GOWNS) ×1
KIT SPINAL PRONEVIEW (KITS) ×1 IMPLANT
MANIFOLD NEPTUNE II (INSTRUMENTS) ×1 IMPLANT
MARKER SKIN DUAL TIP RULER LAB (MISCELLANEOUS) ×1 IMPLANT
NDL SAFETY ECLIP 18X1.5 (MISCELLANEOUS) ×1 IMPLANT
NS IRRIG 1000ML POUR BTL (IV SOLUTION) ×1 IMPLANT
NS IRRIG 500ML POUR BTL (IV SOLUTION) IMPLANT
PACK LAMINECTOMY ARMC (PACKS) ×1 IMPLANT
SURGIFLO W/THROMBIN 8M KIT (HEMOSTASIS) ×1 IMPLANT
SUT DVC VLOC 3-0 CL 6 P-12 (SUTURE) ×1 IMPLANT
SUT VIC AB 0 CT1 27 (SUTURE) ×1
SUT VIC AB 0 CT1 27XCR 8 STRN (SUTURE) ×1 IMPLANT
SUT VIC AB 2-0 CT1 18 (SUTURE) ×1 IMPLANT
SYR 30ML LL (SYRINGE) ×2 IMPLANT
SYR 3ML LL SCALE MARK (SYRINGE) ×1 IMPLANT
TRAP FLUID SMOKE EVACUATOR (MISCELLANEOUS) ×1 IMPLANT
WATER STERILE IRR 1000ML POUR (IV SOLUTION) ×2 IMPLANT

## 2022-09-17 NOTE — Discharge Summary (Signed)
Discharge Summary  Patient ID: Larry Russell MRN: [id number] DOB/AGE: 69-11-1955 69 y.o.  Admit date: 09/17/2022 Discharge date: 09/17/2022  Admission Diagnoses: Lumbar radiculopathy Discharge Diagnoses:  Active Problems:   * No active hospital problems. *   Discharged Condition: stable  Hospital Course:  Larry Russell is a 67 y.o s/p left L5-S1 microdiscectomy.  His intraoperative course was uncomplicated.  He was evaluated in PACU and discharged home after ambulating, urinating, and tolerating p.o. intake.  Consults: None  Significant Diagnostic Studies: none  Treatments: surgery: As above.  Please see separately dictated operative report for further details.  Discharge Exam: Blood pressure (!) 178/98, pulse 77, temperature (!) 97.4 F (36.3 C), temperature source Temporal, resp. rate 18, height 6' (1.829 m), weight 91.4 kg, SpO2 97%. CN II-XII grossly intact MAEW Incision with clean postoperative dressing in place.  Disposition: Discharge disposition: 01-Home or Self Care        Allergies as of 09/17/2022       Reactions   Celebrex [celecoxib] Itching   Iodine Other (See Comments)   Kiwi Extract Itching   Meloxicam Swelling   Shellfish Allergy Hives, Itching        Medication List     STOP taking these medications    Humira (2 Syringe) 40 MG/0.4ML prefilled syringe Generic drug: adalimumab       TAKE these medications    clobetasol 0.05 % external solution Commonly known as: TEMOVATE Apply 1 Application topically as directed. Qd up to 5 days a week to affected area of psoriasis scalp as needed for flares   diclofenac 75 MG EC tablet Commonly known as: VOLTAREN   gabapentin 300 MG capsule Commonly known as: NEURONTIN Take 300 mg by mouth as needed.   ibuprofen 600 MG tablet Commonly known as: ADVIL Take by mouth.   multivitamin with minerals tablet Take 1 tablet by mouth daily.   omeprazole 40 MG capsule Commonly known as:  PRILOSEC Take 40 mg by mouth daily.   oxyCODONE 5 MG immediate release tablet Commonly known as: Roxicodone Take 1 tablet (5 mg total) by mouth every 4 (four) hours as needed for severe pain.   tiZANidine 2 MG tablet Commonly known as: ZANAFLEX Take 1 tablet (2 mg total) by mouth 3 (three) times daily as needed for muscle spasms.   Vtama 1 % Crea Generic drug: Tapinarof Apply 1 application topically daily. Qd to aa psoriasis on body What changed:  when to take this reasons to take this         Signed: Susanne Borders 09/17/2022, 8:53 AM

## 2022-09-17 NOTE — Anesthesia Procedure Notes (Signed)
Procedure Name: Intubation Date/Time: 09/17/2022 7:23 AM  Performed by: Omer Jack, CRNAPre-anesthesia Checklist: Patient identified, Patient being monitored, Timeout performed, Emergency Drugs available and Suction available Patient Re-evaluated:Patient Re-evaluated prior to induction Oxygen Delivery Method: Circle system utilized Preoxygenation: Pre-oxygenation with 100% oxygen Induction Type: IV induction Ventilation: Mask ventilation without difficulty Laryngoscope Size: McGraph, 4 and Mac Grade View: Grade I Tube type: Oral Tube size: 7.5 mm Number of attempts: 1 Airway Equipment and Method: Stylet Placement Confirmation: ETT inserted through vocal cords under direct vision, positive ETCO2 and breath sounds checked- equal and bilateral Secured at: 22 cm Tube secured with: Tape Dental Injury: Teeth and Oropharynx as per pre-operative assessment

## 2022-09-17 NOTE — Telephone Encounter (Signed)
  Media Information  Document Information  Urgent call

## 2022-09-17 NOTE — Anesthesia Preprocedure Evaluation (Addendum)
Anesthesia Evaluation  Patient identified by MRN, date of birth, ID band Patient awake    Reviewed: Allergy & Precautions, NPO status , Patient's Chart, lab work & pertinent test results  History of Anesthesia Complications Negative for: history of anesthetic complications  Airway Mallampati: II   Neck ROM: Full    Dental  (+) Missing   Pulmonary neg pulmonary ROS   Pulmonary exam normal breath sounds clear to auscultation       Cardiovascular hypertension (consistenly runs 150s systolic at home; not on medication), Normal cardiovascular exam Rhythm:Regular Rate:Normal  ECG 09/05/22: normal   Neuro/Psych  Neuromuscular disease (neuropathy)    GI/Hepatic ,GERD (Barrett esophagus)  ,,  Endo/Other  negative endocrine ROS    Renal/GU negative Renal ROS     Musculoskeletal  (+) Arthritis ,    Abdominal   Peds  Hematology negative hematology ROS (+)   Anesthesia Other Findings   Reproductive/Obstetrics                             Anesthesia Physical Anesthesia Plan  ASA: 3  Anesthesia Plan: General   Post-op Pain Management:    Induction: Intravenous  PONV Risk Score and Plan: 2 and Ondansetron, Dexamethasone and Treatment may vary due to age or medical condition  Airway Management Planned: Oral ETT  Additional Equipment:   Intra-op Plan:   Post-operative Plan: Extubation in OR  Informed Consent: I have reviewed the patients History and Physical, chart, labs and discussed the procedure including the risks, benefits and alternatives for the proposed anesthesia with the patient or authorized representative who has indicated his/her understanding and acceptance.     Dental advisory given  Plan Discussed with: CRNA  Anesthesia Plan Comments: (Patient consented for risks of anesthesia including but not limited to:  - adverse reactions to medications - damage to eyes, teeth, lips  or other oral mucosa - nerve damage due to positioning  - sore throat or hoarseness - damage to heart, brain, nerves, lungs, other parts of body or loss of life  Informed patient about role of CRNA in peri- and intra-operative care.  Patient voiced understanding.)        Anesthesia Quick Evaluation

## 2022-09-17 NOTE — Interval H&P Note (Signed)
History and Physical Interval Note:  09/17/2022 7:00 AM  Larry Russell  has presented today for surgery, with the diagnosis of M54.16 lumbar radiculopathy.  The various methods of treatment have been discussed with the patient and family. After consideration of risks, benefits and other options for treatment, the patient has consented to  Procedure(s): LEFT L5-S1 MICRODISCECTOMY (Left) as a surgical intervention.  The patient's history has been reviewed, patient examined, no change in status, stable for surgery.  I have reviewed the patient's chart and labs.  Questions were answered to the patient's satisfaction.    Heart sounds normal no MRG. Chest Clear to Auscultation Bilaterally.   [name]

## 2022-09-17 NOTE — Transfer of Care (Signed)
Immediate Anesthesia Transfer of Care Note  Patient: Larry Russell  Procedure(s) Performed: LEFT L5-S1 MICRODISCECTOMY (Left: Spine Lumbar)  Patient Location: PACU  Anesthesia Type:General  Level of Consciousness: awake and drowsy  Airway & Oxygen Therapy: Patient Spontanous Breathing and Patient connected to face mask oxygen  Post-op Assessment: Report given to RN and Post -op Vital signs reviewed and stable  Post vital signs: Reviewed and stable  Last Vitals:  Vitals Value Taken Time  BP 141/92 09/17/22 0900  Temp    Pulse 88 09/17/22 0901  Resp 19 09/17/22 0901  SpO2 97 % 09/17/22 0901  Vitals shown include unfiled device data.  Last Pain:  Vitals:   09/17/22 0628  TempSrc: Temporal  PainSc: 1          Complications: No notable events documented.

## 2022-09-17 NOTE — Anesthesia Postprocedure Evaluation (Signed)
Anesthesia Post Note  Patient: Larry Russell  Procedure(s) Performed: LEFT L5-S1 MICRODISCECTOMY (Left: Spine Lumbar)  Patient location during evaluation: PACU Anesthesia Type: General Level of consciousness: awake and alert, oriented and patient cooperative Pain management: pain level controlled Vital Signs Assessment: post-procedure vital signs reviewed and stable Respiratory status: spontaneous breathing, nonlabored ventilation and respiratory function stable Cardiovascular status: blood pressure returned to baseline and stable Postop Assessment: adequate PO intake Anesthetic complications: no   No notable events documented.   Last Vitals:  Vitals:   09/17/22 0938 09/17/22 0944  BP:  (!) 147/83  Pulse:  67  Resp:  18  Temp: (!) 36.1 C (!) 36.1 C  SpO2:  97%    Last Pain:  Vitals:   09/17/22 0944  TempSrc: Temporal  PainSc: 0-No pain                 Reed Breech

## 2022-09-17 NOTE — Discharge Instructions (Addendum)
Your surgeon has performed an operation on your lumbar spine (low back) to relieve pressure on one or more nerves. Many times, patients feel better immediately after surgery and can "overdo it." Even if you feel well, it is important that you follow these activity guidelines. If you do not let your back heal properly from the surgery, you can increase the chance of a disc herniation and/or return of your symptoms. The following are instructions to help in your recovery once you have been discharged from the hospital.  * It is ok to take NSAIDs after surgery.  Activity    No bending, lifting, or twisting ("BLT"). Avoid lifting objects heavier than 10 pounds (gallon milk jug).  Where possible, avoid household activities that involve lifting, bending, pushing, or pulling such as laundry, vacuuming, grocery shopping, and childcare. Try to arrange for help from friends and family for these activities while your back heals.  Increase physical activity slowly as tolerated.  Taking short walks is encouraged, but avoid strenuous exercise. Do not jog, run, bicycle, lift weights, or participate in any other exercises unless specifically allowed by your doctor. Avoid prolonged sitting, including car rides.  Talk to your doctor before resuming sexual activity.  You should not drive until cleared by your doctor.  Until released by your doctor, you should not return to work or school.  You should rest at home and let your body heal.   You may shower three days after your surgery.  After showering, lightly dab your incision dry. Do not take a tub bath or go swimming for 3 weeks, or until approved by your doctor at your follow-up appointment.  If you smoke, we strongly recommend that you quit.  Smoking has been proven to interfere with normal healing in your back and will dramatically reduce the success rate of your surgery. Please contact [facility] (800-QUIT-NOW) and use the resources at www.[facility].com for  assistance in stopping smoking.  Surgical Incision   If you have a dressing on your incision, you may remove it three days after your surgery. Keep your incision area clean and dry.  If you have staples or stitches on your incision, you should have a follow up scheduled for removal. If you do not have staples or stitches, you will have steri-strips (small pieces of surgical tape) or Dermabond glue. The steri-strips/glue should begin to peel away within about a week (it is fine if the steri-strips fall off before then). If the strips are still in place one week after your surgery, you may gently remove them.  Diet            You may return to your usual diet. Be sure to stay hydrated.  When to Contact us  Although your surgery and recovery will likely be uneventful, you may have some residual numbness, aches, and pains in your back and/or legs. This is normal and should improve in the next few weeks.  However, should you experience any of the following, contact us immediately: New numbness or weakness Pain that is progressively getting worse, and is not relieved by your pain medications or rest Bleeding, redness, swelling, pain, or drainage from surgical incision Chills or flu-like symptoms Fever greater than 101.0 F (38.3 C) Problems with bowel or bladder functions Difficulty breathing or shortness of breath Warmth, tenderness, or swelling in your calf  Contact Information How to contact us:  If you have any questions/concerns before or after surgery, you can reach [place] at [phone], or you can  send a [employer]. We can be reached by phone or mychart 8am-4pm, Monday-Friday.  *Please note: Calls after 4pm are forwarded to a third party answering service. Mychart messages are not routinely monitored during evenings, weekends, and holidays. Please call our office to contact the answering service for urgent concerns during non-business hours.   AMBULATORY SURGERY  DISCHARGE  INSTRUCTIONS   The drugs that you were given will stay in your system until tomorrow so for the next 24 hours you should not:  Drive an automobile Make any legal decisions Drink any alcoholic beverage   You may resume regular meals tomorrow.  Today it is better to start with liquids and gradually work up to solid foods.  You may eat anything you prefer, but it is better to start with liquids, then soup and crackers, and gradually work up to solid foods.   Please notify your doctor immediately if you have any unusual bleeding, trouble breathing, redness and pain at the surgery site, drainage, fever, or pain not relieved by medication.    Additional Instructions: PLEASE LEAVE EXPAREL (TEAL/GREEN) ARMBAND ON FOR 4 DAYS    Please contact your physician with any problems or Same Day Surgery at [phone], Monday through Friday 6 am to 4 pm, or [place] at [facility] number at [phone].

## 2022-09-17 NOTE — Op Note (Signed)
Indications: [name] is suffering from lumbar radiculopathy. The patient tried and failed conservative management, prompting surgical intervention.  Findings: disc herniation  Preoperative Diagnosis: Lumbar radiculopathy (ICD-10 M54.16) Postoperative Diagnosis: same   EBL: 10 ml IVF: see anesthesia record Drains: none Disposition: Extubated and Stable to PACU Complications: none  No foley catheter was placed.   Preoperative Note:   Risks of surgery discussed include: infection, bleeding, stroke, coma, death, paralysis, CSF leak, nerve/spinal cord injury, numbness, tingling, weakness, complex regional pain syndrome, recurrent stenosis and/or disc herniation, vascular injury, development of instability, neck/back pain, need for further surgery, persistent symptoms, development of deformity, and the risks of anesthesia. The patient understood these risks and agreed to proceed.  Operative Note:   1) Left L5/S1 microdiscectomy  The patient was then brought from the preoperative center with intravenous access established.  The patient underwent general anesthesia and endotracheal tube intubation, and was then rotated on the [place] rail top where all pressure points were appropriately padded.  The skin was then thoroughly cleansed.  Perioperative antibiotic prophylaxis was administered.  Sterile prep and drapes were then applied and a timeout was then observed.  C-arm was brought into the field under sterile conditions, and the L5-S1 disc space identified and marked with an incision.  Once this was complete a 2 cm incision was opened with the use of a #10 blade knife.  The Metrx tubes were sequentially advanced under lateral fluoroscopy until a 18 x 50 mm Metrx tube was placed over the facet and lamina and secured to the bed.    The microscope was then sterilely brought into the field and muscle creep was hemostased with a bipolar and resected with a pituitary rongeur.  A Bovie  extender was then used to expose the spinous process and lamina.  Careful attention was placed to not violate the facet capsule. A 3 mm matchstick drill bit was then used to make a hemi-laminotomy trough until the ligamentum flavum was exposed.  This was extended to the base of the spinous process.  Once this was complete and the underlying ligamentum flavum was visualized, the ligamentum was dissected with an up angle curette and resected with a #2 and #3 mm biting Kerrison.  The laminotomy opening was also expanded in similar fashion and hemostasis was obtained with Surgifoam and a patty as well as bone wax.  The rostral aspect of the caudal level of the lamina was also resected with a #2 biting Kerrison effort to further enhance exposure.  Once the underlying dura was visualized a Penfield 4 was then used to dissect and expose the traversing nerve root.  Once this was identified a nerve root retractor suction was used to mobilize this medially.  The venous plexus was hemostased with Surgifoam and light bipolar use.  A small penfied was then used to make a small annulotomy within the disc space and disc space contents were noted to come through the annulus.    The disc herniation was identified and dissected free using a balltip probe. The pituitary rongeur was used to remove the extruded disc fragments. Once the thecal sac and nerve root were noted to be relaxed and under less tension the ball-tipped feeler was passed along the foramen distally to ensure no residual compression was noted.    Depo-Medrol was placed along the nerve root.  The area was irrigated. The tube system was then removed under microscopic visualization and hemostasis was obtained with a bipolar.    The fascial  layer was reapproximated with the use of a 0- Vicryl suture.  Subcutaneous tissue layer was reapproximated using 2-0 Vicryl suture.  3-0 monocryl was used on the skin. The skin was then cleansed and Dermabond was used to close  the skin opening.  Patient was then rotated back to the preoperative bed awakened from anesthesia and taken to recovery all counts are correct in this case.   I performed the entire procedure with the assistance of Larry Charity PA as an Designer, television/film set. An assistant was required for this procedure due to the complexity.  The assistant provided assistance in tissue manipulation and suction, and was required for the successful and safe performance of the procedure. I performed the critical portions of the procedure.   Venetia Night MD

## 2022-09-17 NOTE — Telephone Encounter (Signed)
I spoke with Larry Russell. He reports that he still having the exact same pain as before surgery (into his buttock and down his leg). He inquired if this is normal. I discussed with him that it is not unusual to have good days and bad days over the next several weeks, and that it may take time before he experiences relief. I encouraged him to take the oxycodone and tizanidine as needed and to contact us the if the pain worsens or is not tolerable, at which point I would discuss a medication change such as a steroid taper or increasing his gabapentin with one of the providers. I also reminded him of the post op restrictions. He verbalized understanding.

## 2022-09-18 ENCOUNTER — Encounter: Payer: Self-pay | Admitting: Neurosurgery

## 2022-09-19 NOTE — Progress Notes (Signed)
   REFERRING PHYSICIAN:  Danella Penton, Md [address] [facility] [place],  [place] [address]  DOS: 09/17/22  left L5-S1 microdiscectomy  HISTORY OF PRESENT ILLNESS: Larry Russell is approximately 2 weeks status post left L5-S1 microdisectomy. Was given oxycodone and zanaflex on discharge from the hospital.   He had some increased pain and was advised to increase neurontin 300mg - he is taking one at night and 100mg  at lunch. Cannot tolerate 300mg  at lunch.  He was also called in a medrol dose pack.   He has intermittent soreness in his lower back. He notes left buttock/leg pain that is worse with walking and worse as the day progresses. This is much better with dose pack.   No weakness in his left left. He has neuropathy in both legs.   He is not taking oxycodone or zanaflex. He is taking motrin.   PHYSICAL EXAMINATION:  General: Patient is well developed, well nourished, calm, collected, and in no apparent distress.   NEUROLOGICAL:  General: In no acute distress.   Awake, alert, oriented to person, place, and time.  Pupils equal round and reactive to light.  Facial tone is symmetric.     Strength:            Side Iliopsoas Quads Hamstring PF DF EHL  R 5 5 5 5 5 5   L 5 5 5 5 5 5    Incision c/d/i   ROS (Neurologic):  Negative except as noted above  IMAGING: Nothing new to review.   ASSESSMENT/PLAN:  Larry Russell is doing reasobable s/p above surgery. Treatment options reviewed with patient and following plan made:   - I have advised the patient to lift up to 10 pounds until 6 weeks after surgery (follow up with Dr. Myer Haff).  - Reviewed wound care.  - No bending, twisting, or lifting.  - Okay to restart humira 3 weeks postop.  - He finishes dose pack tomorrow. If pain returns, can try doing neurontin 200mg  at lunch or 100mg  in am and 100mg  at lunch.  - He will also try taking zanaflex (or robaxin he has at home) prn.  - If no  relief with this, may repeat medrol dose pack or set him up for lumbar ESI.  - Follow up as scheduled in 4 weeks and prn.   Advised to contact the office if any questions or concerns arise.  Drake Leach PA-C Department of neurosurgery

## 2022-09-20 NOTE — Telephone Encounter (Signed)
Left L5-S1 microdiscectomy on 09/17/22  Patient is calling that he is still having the exact same pain like he had before surgery on left buttock down leg. He feels the pain gets worse as the day goes on. He is taking all his medications as directed. Can the nurse please call him back [phone].

## 2022-09-20 NOTE — Telephone Encounter (Signed)
It's not uncommon to have leg pain after the surgery due to swelling/inflammation from the surgery.   Please make sure his incision looks okay and no drainage, fevers, or chills.   If pain is worse, I recommend that he take the oxycodone more often.  He can also take ibuprofen 600mg  up to every 6 hours.   Both of these things should help and pain should improve. If not, have him let us know next week.

## 2022-09-20 NOTE — Telephone Encounter (Signed)
I spoke with the patient and notified him about Stacys message. He verbalized understanding on everything and will be in touch next week if he is no better.

## 2022-09-20 NOTE — Telephone Encounter (Signed)
I spoke with the patient and he stated that he is still having a lot of pain in the left buttocks and down the left leg like before surgery. He stated that is gets really bad after he is up and walking and by around 3pm he is very uncomfortable from the pain going down his leg.   He stated that he has not taken any of the oxycodone for his pain but he is taking the following:  Ibuprofen 600mg , 1 in the morning and 1 at night Gabapentin 300mg , 1 at night Tizanidine 2mg , 1 a day   I told him I would send a message to Kennyth Arnold to make her aware of this.

## 2022-09-26 ENCOUNTER — Telehealth: Payer: Self-pay

## 2022-09-26 NOTE — Telephone Encounter (Signed)
Pt called in to report that pain is not getting better and is in fact getting worse, unable to walk more than 155ft without pain. Says it's similar to before surgery.

## 2022-09-26 NOTE — Telephone Encounter (Signed)
Patient had surgery on his back 09/17/22. He did stop his Humira 08/24/22 and was advised to wait until at least two weeks post op to continue.  Patient advised to continue at maintenance dose.

## 2022-09-27 ENCOUNTER — Other Ambulatory Visit: Payer: Self-pay | Admitting: Neurosurgery

## 2022-09-27 MED ORDER — METHYLPREDNISOLONE 4 MG PO TBPK: ORAL_TABLET | ORAL | 0 refills | Status: DC

## 2022-10-02 ENCOUNTER — Ambulatory Visit (INDEPENDENT_AMBULATORY_CARE_PROVIDER_SITE_OTHER): Payer: Medicare HMO | Admitting: Orthopedic Surgery

## 2022-10-02 ENCOUNTER — Encounter: Payer: Self-pay | Admitting: Orthopedic Surgery

## 2022-10-02 VITALS — BP 140/78 | Ht 72.0 in | Wt 201.0 lb

## 2022-10-02 DIAGNOSIS — Z9889 Other specified postprocedural states: Secondary | ICD-10-CM

## 2022-10-02 DIAGNOSIS — Z09 Encounter for follow-up examination after completed treatment for conditions other than malignant neoplasm: Secondary | ICD-10-CM

## 2022-10-02 DIAGNOSIS — M5416 Radiculopathy, lumbar region: Secondary | ICD-10-CM

## 2022-10-05 ENCOUNTER — Telehealth: Payer: Self-pay | Admitting: Neurosurgery

## 2022-10-05 MED ORDER — METHYLPREDNISOLONE 4 MG PO TBPK: ORAL_TABLET | ORAL | 0 refills | Status: DC

## 2022-10-05 NOTE — Telephone Encounter (Addendum)
I spoke with Larry Russell He reports his pain is in his left buttock and down the back of his left leg to his ankle  He is taking: Gabapentin 100-200mg  at lunch (100 or 200 depending on how bad his pain is and if he has to go somewhere) Gabapentin 300mg  every night Ibuprofen 600mg  in the morning and 600mg  at night Oxycodone - he isn't taking this anymore Not taking any muscle relaxers; he tried tizanidine yesterday and it did not help

## 2022-10-05 NOTE — Telephone Encounter (Signed)
I spoke with Mr Wallo and discussed my conversation with Drake Leach, PA-C. See summary of conversation documented by Drake Leach, PA-C. I advised him not to take ibuprofen while taking the medrol dosepack. I advised him that increasing gabapentin may cause drowsiness. He will call us back or notify us at his next visit if he would like to have a referral for an injection.

## 2022-10-05 NOTE — Telephone Encounter (Signed)
Larry Russell spoke to patient. No new weakness. No bowel or bladder issues. His incision looks okay.   He is tolerating neurontin 100-200 at lunch and 300mg  at night. Zanaflex did not help.   Pain is not as bad as it was prior to dosepack, but is getting worse.   Will do repeat medrol dose pack. He is not diabetic. Sent to pharmacy.   Will increase neurontin to 100mg  in am 100-200mg  at lunch, and 300mg  at night. We reviewed side effects at his last visit. No refill needed.   If no improvement with this, will consider lumbar ESI.

## 2022-10-05 NOTE — Telephone Encounter (Signed)
Left L5-S1 microdiscectomy on 09/17/22  Patient asking for prednisone. The PA told him that she would call in a prescription if he felt he needed one. [facility] [address]

## 2022-10-08 MED ORDER — METHYLPREDNISOLONE 4 MG PO TBPK: ORAL_TABLET | ORAL | 0 refills | Status: DC

## 2022-10-08 NOTE — Addendum Note (Signed)
Addended by: Sharlot Gowda on: 10/08/2022 11:10 AM   Modules accepted: Orders

## 2022-10-08 NOTE — Telephone Encounter (Signed)
I confirmed that the previous rx sent on Friday was not received by pharmacy. I resent it and notified the patient.

## 2022-10-08 NOTE — Telephone Encounter (Signed)
Patient calling that [facility] [address] never received rx for prednisone. Can you resend it please.

## 2022-10-09 ENCOUNTER — Encounter: Payer: Self-pay | Admitting: Orthopedic Surgery

## 2022-10-09 NOTE — Progress Notes (Signed)
Patient reviewed with Dr. Myer Haff regarding his postop pain. He agrees with repeat dose pack and recommends ESI if no better.   Drake Leach PA-C

## 2022-10-30 ENCOUNTER — Ambulatory Visit (INDEPENDENT_AMBULATORY_CARE_PROVIDER_SITE_OTHER): Payer: Medicare HMO | Admitting: Neurosurgery

## 2022-10-30 ENCOUNTER — Encounter: Payer: Self-pay | Admitting: Neurosurgery

## 2022-10-30 VITALS — BP 132/82 | Temp 97.8°F | Ht 72.0 in | Wt 201.0 lb

## 2022-10-30 DIAGNOSIS — Z09 Encounter for follow-up examination after completed treatment for conditions other than malignant neoplasm: Secondary | ICD-10-CM

## 2022-10-30 DIAGNOSIS — M5416 Radiculopathy, lumbar region: Secondary | ICD-10-CM

## 2022-10-30 NOTE — Progress Notes (Signed)
   REFERRING PHYSICIAN:  Danella Penton, Md [address] [facility] [place],  [place] [address]  DOS: 09/17/22  left L5-S1 microdiscectomy  HISTORY OF PRESENT ILLNESS: Larry Russell is status post left L5-S1 microdisectomy.  He was having a lot of difficulty with postoperative pain, but is doing much better over the past 2 weeks.  He had a prednisone taper which helped significantly.  He has some pain on the outside of his left hip.  Overall, he is much improved compared to before surgery.     PHYSICAL EXAMINATION:  General: Patient is well developed, well nourished, calm, collected, and in no apparent distress.   NEUROLOGICAL:  General: In no acute distress.   Awake, alert, oriented to person, place, and time.  Pupils equal round and reactive to light.  Facial tone is symmetric.     Strength:            Side Iliopsoas Quads Hamstring PF DF EHL  R 5 5 5 5 5 5   L 5 5 5 5 5 5    Incision c/d/i   ROS (Neurologic):  Negative except as noted above  IMAGING: Nothing new to review.   ASSESSMENT/PLAN:  BUZ DORGAN is doing better s/p above surgery.  He has some pain around his left hip which could be from bursitis.  At this point, I recommended that he slowly increase his activity.  He is seeing Dr. Ernest Pine for evaluation in approximately 6 weeks.  If his pain worsens in the meantime, I have asked him to contact me.  We reviewed his activity limitations.  Will see him back in approximately 5 weeks.    Venetia Night MD Department of neurosurgery

## 2022-12-01 NOTE — Progress Notes (Unsigned)
   REFERRING PHYSICIAN:  Danella Penton, Md [address] [facility] [place],  [place] [address]  DOS: 09/17/22  left L5-S1 microdiscectomy  HISTORY OF PRESENT ILLNESS:  He was feeling better at his last visit.   Overall, he is doing well. He notes some intermittent soreness/fatigue in his back with activity. He stops to rest and it gets better. He has some intermittent pain in left buttock/posterior thigh with prolonged walking. Goes away when he stops to rest. He has known neuropathy in both legs.   He is getting back to his regular activities.   He is taking prn motrin. He is not taking neurontin.    PHYSICAL EXAMINATION:  General: Patient is well developed, well nourished, calm, collected, and in no apparent distress.   NEUROLOGICAL:  General: In no acute distress.   Awake, alert, oriented to person, place, and time.  Pupils equal round and reactive to light.  Facial tone is symmetric.     Strength:           Side Iliopsoas Quads Hamstring PF DF EHL  R 5 5 5 5 5 5   L 5 5 5 5 5 5    Incision well healed   ROS (Neurologic):  Negative except as noted above  IMAGING: Nothing new to review.   ASSESSMENT/PLAN:  Larry Russell is doing very well s/p above surgery. Treatment options reviewed with patient and following plan made:   - Slowly return to activities as tolerated.  - Discussed PT for his back. He declines. Will let me know if this changes.  - He will f/u prn.   Advised to contact the office if any questions or concerns arise.  Drake Leach PA-C Department of neurosurgery

## 2022-12-06 ENCOUNTER — Encounter: Payer: Self-pay | Admitting: Orthopedic Surgery

## 2022-12-06 ENCOUNTER — Ambulatory Visit (INDEPENDENT_AMBULATORY_CARE_PROVIDER_SITE_OTHER): Payer: Medicare HMO | Admitting: Orthopedic Surgery

## 2022-12-06 VITALS — BP 136/82 | Temp 98.0°F | Ht 72.0 in | Wt 201.0 lb

## 2022-12-06 DIAGNOSIS — M5416 Radiculopathy, lumbar region: Secondary | ICD-10-CM | POA: Diagnosis not present

## 2022-12-06 DIAGNOSIS — Z09 Encounter for follow-up examination after completed treatment for conditions other than malignant neoplasm: Secondary | ICD-10-CM

## 2022-12-06 DIAGNOSIS — Z9889 Other specified postprocedural states: Secondary | ICD-10-CM

## 2022-12-19 ENCOUNTER — Ambulatory Visit: Payer: Medicare HMO | Admitting: Dermatology

## 2022-12-19 DIAGNOSIS — Z79899 Other long term (current) drug therapy: Secondary | ICD-10-CM

## 2022-12-19 DIAGNOSIS — Z7189 Other specified counseling: Secondary | ICD-10-CM

## 2022-12-19 DIAGNOSIS — L409 Psoriasis, unspecified: Secondary | ICD-10-CM | POA: Diagnosis not present

## 2022-12-19 DIAGNOSIS — L4050 Arthropathic psoriasis, unspecified: Secondary | ICD-10-CM

## 2022-12-19 DIAGNOSIS — L404 Guttate psoriasis: Secondary | ICD-10-CM

## 2022-12-19 MED ORDER — CLOBETASOL PROPIONATE 0.05 % EX SOLN: 1.0000 | CUTANEOUS | 6 refills | Status: DC

## 2022-12-19 NOTE — Patient Instructions (Addendum)

## 2022-12-19 NOTE — Progress Notes (Signed)
   Follow-Up Visit   Subjective  Larry Russell is a 67 y.o. male who presents for the following: Psoriasis 6 month follow up reports he broke out in rash at back, neck in July was not sure if psoriasis related.   Patient using humira , clobetasol ointment, clobetasol solution, and vtama cream as needed for flares.    The following portions of the chart were reviewed this encounter and updated as appropriate: medications, allergies, medical history  Review of Systems:  No other skin or systemic complaints except as noted in HPI or Assessment and Plan.  Objective  Well appearing patient in no apparent distress; mood and affect are within normal limits.  Areas Examined: Scalp, groin, legs, nails   Relevant exam findings are noted in the Assessment and Plan.      Assessment & Plan   Psoriasis  Related Medications Tapinarof (VTAMA) 1 % CREA Apply 1 application topically daily. Qd to aa psoriasis on body  clobetasol (TEMOVATE) 0.05 % external solution Apply 1 Application topically as directed. 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 Condition is bothersome/symptomatic for patient. Currently flared.    Counseling and coordination of care for severe psoriasis on systemic treatment  Psoriasis - severe on systemic treatment.  Psoriasis is a chronic non-curable, but treatable genetic/hereditary disease that may have other systemic features affecting other organ systems such as joints (Psoriatic Arthritis).  It is linked with heart disease, inflammatory bowel disease, non-alcoholic fatty liver disease, and depression. Significant skin  psoriasis and/or psoriatic arthritis may have significant symptoms and affects activities of daily activity and often benefits from systemic treatments.  These systemic treatments have some potential side effects including immunosuppression and require pre-treatment laboratory screening and periodic laboratory monitoring and periodic in person evaluation and monitoring by the attending dermatologist physician (long term medication management).   Patient denies joint paint   Treatment Plan: Cont Humira 40mg  sq injections q 2 wks Cont Clobetasol oint qd 5d/wk prn flares patient will call for refills Cont Clobetasol sol qd 5d/wk aa scalp prn flares refills sent to [facility] Cont Vtama cr qd aa body prn flares patient will call for refills  Reviewed risks of biologics including immunosuppression, infections, injection site reaction, and failure to improve condition. Goal is control of skin condition, not cure.  Some older biologics such as Humira and Enbrel may slightly increase risk of malignancy and may worsen congestive heart failure.  Taltz and Cosentyx may cause inflammatory bowel disease to flare. The use of biologics requires long term medication management, including periodic office visits and monitoring of blood work.   Long term medication management.  Patient is using long term (months to years) prescription medication  to control their dermatologic condition.  These medications require periodic monitoring to evaluate for efficacy and side effects and may require periodic laboratory monitoring.   Return in about 6 months (around 06/19/2023) for psoriasis.  Larry Russell, CMA, am acting as scribe for Armida Sans, MD.   Documentation: I have reviewed the above documentation for accuracy and completeness, and I agree with the above.  Armida Sans, MD

## 2022-12-29 ENCOUNTER — Encounter: Payer: Self-pay | Admitting: Dermatology

## 2023-02-21 ENCOUNTER — Ambulatory Visit: Payer: Medicare HMO | Admitting: Dermatology

## 2023-02-21 DIAGNOSIS — L2989 Other pruritus: Secondary | ICD-10-CM | POA: Diagnosis not present

## 2023-02-21 DIAGNOSIS — Z872 Personal history of diseases of the skin and subcutaneous tissue: Secondary | ICD-10-CM | POA: Diagnosis not present

## 2023-03-05 DIAGNOSIS — J40 Bronchitis, not specified as acute or chronic: Secondary | ICD-10-CM | POA: Diagnosis not present

## 2023-03-05 DIAGNOSIS — B349 Viral infection, unspecified: Secondary | ICD-10-CM | POA: Diagnosis not present

## 2023-03-05 DIAGNOSIS — J45902 Unspecified asthma with status asthmaticus: Secondary | ICD-10-CM | POA: Diagnosis not present

## 2023-03-19 DIAGNOSIS — H25013 Cortical age-related cataract, bilateral: Secondary | ICD-10-CM | POA: Diagnosis not present

## 2023-04-16 DIAGNOSIS — E538 Deficiency of other specified B group vitamins: Secondary | ICD-10-CM | POA: Diagnosis not present

## 2023-04-16 DIAGNOSIS — E782 Mixed hyperlipidemia: Secondary | ICD-10-CM | POA: Diagnosis not present

## 2023-04-18 ENCOUNTER — Other Ambulatory Visit: Payer: Self-pay

## 2023-04-18 ENCOUNTER — Encounter: Payer: Self-pay | Admitting: Pharmacist

## 2023-04-18 ENCOUNTER — Other Ambulatory Visit (HOSPITAL_COMMUNITY): Payer: Self-pay

## 2023-04-18 DIAGNOSIS — R739 Hyperglycemia, unspecified: Secondary | ICD-10-CM | POA: Diagnosis not present

## 2023-04-18 DIAGNOSIS — Z125 Encounter for screening for malignant neoplasm of prostate: Secondary | ICD-10-CM | POA: Diagnosis not present

## 2023-04-18 DIAGNOSIS — E538 Deficiency of other specified B group vitamins: Secondary | ICD-10-CM | POA: Diagnosis not present

## 2023-04-18 DIAGNOSIS — E782 Mixed hyperlipidemia: Secondary | ICD-10-CM | POA: Diagnosis not present

## 2023-04-18 DIAGNOSIS — L4050 Arthropathic psoriasis, unspecified: Secondary | ICD-10-CM | POA: Diagnosis not present

## 2023-04-18 MED ORDER — IBUPROFEN 600 MG PO TABS
600.0000 mg | ORAL_TABLET | Freq: Four times a day (QID) | ORAL | 3 refills | Status: DC
  Filled 2023-04-18 (×2): qty 100, 25d supply, fill #0
  Filled 2023-07-09: qty 100, 25d supply, fill #1
  Filled 2023-11-04: qty 100, 25d supply, fill #2

## 2023-04-18 MED ORDER — SULFASALAZINE 500 MG PO TBEC
500.0000 mg | DELAYED_RELEASE_TABLET | Freq: Three times a day (TID) | ORAL | 3 refills | Status: DC
  Filled 2023-04-18: qty 270, 90d supply, fill #0

## 2023-04-18 MED ORDER — TIZANIDINE HCL 2 MG PO TABS
2.0000 mg | ORAL_TABLET | Freq: Three times a day (TID) | ORAL | 11 refills | Status: AC | PRN
  Filled 2023-04-18 (×2): qty 90, 30d supply, fill #0

## 2023-04-18 MED ORDER — OMEPRAZOLE 40 MG PO CPDR
40.0000 mg | DELAYED_RELEASE_CAPSULE | Freq: Every day | ORAL | 3 refills | Status: AC
  Filled 2023-04-18 (×2): qty 90, 90d supply, fill #0
  Filled 2023-07-09: qty 90, 90d supply, fill #1
  Filled 2024-01-21: qty 90, 90d supply, fill #2

## 2023-04-23 ENCOUNTER — Other Ambulatory Visit: Payer: Self-pay | Admitting: Internal Medicine

## 2023-04-23 DIAGNOSIS — L4050 Arthropathic psoriasis, unspecified: Secondary | ICD-10-CM

## 2023-04-23 DIAGNOSIS — E782 Mixed hyperlipidemia: Secondary | ICD-10-CM

## 2023-04-24 ENCOUNTER — Inpatient Hospital Stay: Admission: RE | Admit: 2023-04-24 | Source: Ambulatory Visit

## 2023-05-01 ENCOUNTER — Ambulatory Visit
Admission: RE | Admit: 2023-05-01 | Discharge: 2023-05-01 | Disposition: A | Discharge status: Home / Self Care | Payer: Self-pay | Source: Ambulatory Visit | Attending: Internal Medicine | Admitting: Internal Medicine

## 2023-05-01 DIAGNOSIS — E782 Mixed hyperlipidemia: Secondary | ICD-10-CM | POA: Insufficient documentation

## 2023-05-01 DIAGNOSIS — L4050 Arthropathic psoriasis, unspecified: Secondary | ICD-10-CM | POA: Insufficient documentation

## 2023-05-20 DIAGNOSIS — L4050 Arthropathic psoriasis, unspecified: Secondary | ICD-10-CM | POA: Diagnosis not present

## 2023-05-20 DIAGNOSIS — G959 Disease of spinal cord, unspecified: Secondary | ICD-10-CM | POA: Diagnosis not present

## 2023-05-20 DIAGNOSIS — G629 Polyneuropathy, unspecified: Secondary | ICD-10-CM | POA: Diagnosis not present

## 2023-05-20 DIAGNOSIS — R252 Cramp and spasm: Secondary | ICD-10-CM | POA: Diagnosis not present

## 2023-05-29 ENCOUNTER — Telehealth: Payer: Self-pay

## 2023-05-29 NOTE — Telephone Encounter (Signed)
 Left message on patient's spouse phone to return my call regarding [employer] patient assistance paperwork. aw

## 2023-06-17 DIAGNOSIS — R252 Cramp and spasm: Secondary | ICD-10-CM | POA: Diagnosis not present

## 2023-06-17 DIAGNOSIS — M5416 Radiculopathy, lumbar region: Secondary | ICD-10-CM | POA: Diagnosis not present

## 2023-06-17 DIAGNOSIS — M4807 Spinal stenosis, lumbosacral region: Secondary | ICD-10-CM | POA: Diagnosis not present

## 2023-06-18 ENCOUNTER — Other Ambulatory Visit: Payer: Self-pay | Admitting: Family Medicine

## 2023-06-18 DIAGNOSIS — M5416 Radiculopathy, lumbar region: Secondary | ICD-10-CM

## 2023-06-21 ENCOUNTER — Other Ambulatory Visit: Payer: Self-pay | Admitting: Family Medicine

## 2023-06-21 DIAGNOSIS — M5416 Radiculopathy, lumbar region: Secondary | ICD-10-CM

## 2023-06-21 DIAGNOSIS — T1590XA Foreign body on external eye, part unspecified, unspecified eye, initial encounter: Secondary | ICD-10-CM

## 2023-07-04 ENCOUNTER — Ambulatory Visit: Payer: Medicare HMO | Admitting: Dermatology

## 2023-07-09 ENCOUNTER — Other Ambulatory Visit (HOSPITAL_COMMUNITY): Payer: Self-pay

## 2023-07-11 DIAGNOSIS — M9905 Segmental and somatic dysfunction of pelvic region: Secondary | ICD-10-CM | POA: Diagnosis not present

## 2023-07-11 DIAGNOSIS — M5432 Sciatica, left side: Secondary | ICD-10-CM | POA: Diagnosis not present

## 2023-07-11 DIAGNOSIS — M4306 Spondylolysis, lumbar region: Secondary | ICD-10-CM | POA: Diagnosis not present

## 2023-07-11 DIAGNOSIS — M9903 Segmental and somatic dysfunction of lumbar region: Secondary | ICD-10-CM | POA: Diagnosis not present

## 2023-07-12 ENCOUNTER — Other Ambulatory Visit (HOSPITAL_COMMUNITY): Payer: Self-pay

## 2023-07-17 DIAGNOSIS — M4306 Spondylolysis, lumbar region: Secondary | ICD-10-CM | POA: Diagnosis not present

## 2023-07-17 DIAGNOSIS — M9905 Segmental and somatic dysfunction of pelvic region: Secondary | ICD-10-CM | POA: Diagnosis not present

## 2023-07-17 DIAGNOSIS — M5432 Sciatica, left side: Secondary | ICD-10-CM | POA: Diagnosis not present

## 2023-07-17 DIAGNOSIS — M9903 Segmental and somatic dysfunction of lumbar region: Secondary | ICD-10-CM | POA: Diagnosis not present

## 2023-07-18 ENCOUNTER — Ambulatory Visit: Payer: Medicare HMO | Admitting: Dermatology

## 2023-07-24 DIAGNOSIS — M9903 Segmental and somatic dysfunction of lumbar region: Secondary | ICD-10-CM | POA: Diagnosis not present

## 2023-07-24 DIAGNOSIS — M9905 Segmental and somatic dysfunction of pelvic region: Secondary | ICD-10-CM | POA: Diagnosis not present

## 2023-07-24 DIAGNOSIS — M4306 Spondylolysis, lumbar region: Secondary | ICD-10-CM | POA: Diagnosis not present

## 2023-07-24 DIAGNOSIS — M5432 Sciatica, left side: Secondary | ICD-10-CM | POA: Diagnosis not present

## 2023-07-31 ENCOUNTER — Ambulatory Visit
Admission: RE | Admit: 2023-07-31 | Discharge: 2023-07-31 | Disposition: A | Discharge status: Home / Self Care | Source: Ambulatory Visit | Attending: Family Medicine | Admitting: Family Medicine

## 2023-07-31 DIAGNOSIS — T1590XA Foreign body on external eye, part unspecified, unspecified eye, initial encounter: Secondary | ICD-10-CM

## 2023-07-31 DIAGNOSIS — Z0189 Encounter for other specified special examinations: Secondary | ICD-10-CM | POA: Diagnosis not present

## 2023-07-31 DIAGNOSIS — M5416 Radiculopathy, lumbar region: Secondary | ICD-10-CM | POA: Diagnosis not present

## 2023-08-15 ENCOUNTER — Other Ambulatory Visit (HOSPITAL_COMMUNITY): Payer: Self-pay

## 2023-08-19 ENCOUNTER — Other Ambulatory Visit (HOSPITAL_COMMUNITY): Payer: Self-pay

## 2023-08-19 ENCOUNTER — Other Ambulatory Visit: Payer: Self-pay

## 2023-08-19 MED ORDER — GABAPENTIN 300 MG PO CAPS
300.0000 mg | ORAL_CAPSULE | Freq: Two times a day (BID) | ORAL | 11 refills | Status: AC
  Filled 2023-08-19: qty 60, 30d supply, fill #0

## 2023-08-26 ENCOUNTER — Encounter: Payer: Self-pay | Admitting: Dermatology

## 2023-08-26 ENCOUNTER — Other Ambulatory Visit (HOSPITAL_COMMUNITY): Payer: Self-pay

## 2023-08-26 ENCOUNTER — Ambulatory Visit: Admitting: Dermatology

## 2023-08-26 DIAGNOSIS — M4807 Spinal stenosis, lumbosacral region: Secondary | ICD-10-CM | POA: Diagnosis not present

## 2023-08-26 DIAGNOSIS — L821 Other seborrheic keratosis: Secondary | ICD-10-CM

## 2023-08-26 DIAGNOSIS — M5126 Other intervertebral disc displacement, lumbar region: Secondary | ICD-10-CM | POA: Diagnosis not present

## 2023-08-26 DIAGNOSIS — L409 Psoriasis, unspecified: Secondary | ICD-10-CM

## 2023-08-26 DIAGNOSIS — L820 Inflamed seborrheic keratosis: Secondary | ICD-10-CM | POA: Diagnosis not present

## 2023-08-26 DIAGNOSIS — D1801 Hemangioma of skin and subcutaneous tissue: Secondary | ICD-10-CM | POA: Diagnosis not present

## 2023-08-26 DIAGNOSIS — Z7189 Other specified counseling: Secondary | ICD-10-CM

## 2023-08-26 DIAGNOSIS — L4050 Arthropathic psoriasis, unspecified: Secondary | ICD-10-CM | POA: Diagnosis not present

## 2023-08-26 DIAGNOSIS — Z1283 Encounter for screening for malignant neoplasm of skin: Secondary | ICD-10-CM

## 2023-08-26 DIAGNOSIS — D229 Melanocytic nevi, unspecified: Secondary | ICD-10-CM

## 2023-08-26 DIAGNOSIS — W908XXA Exposure to other nonionizing radiation, initial encounter: Secondary | ICD-10-CM | POA: Diagnosis not present

## 2023-08-26 DIAGNOSIS — Z79899 Other long term (current) drug therapy: Secondary | ICD-10-CM

## 2023-08-26 DIAGNOSIS — L814 Other melanin hyperpigmentation: Secondary | ICD-10-CM

## 2023-08-26 DIAGNOSIS — M5416 Radiculopathy, lumbar region: Secondary | ICD-10-CM | POA: Diagnosis not present

## 2023-08-26 DIAGNOSIS — L578 Other skin changes due to chronic exposure to nonionizing radiation: Secondary | ICD-10-CM

## 2023-08-26 DIAGNOSIS — M1990 Unspecified osteoarthritis, unspecified site: Secondary | ICD-10-CM | POA: Diagnosis not present

## 2023-08-26 DIAGNOSIS — D692 Other nonthrombocytopenic purpura: Secondary | ICD-10-CM | POA: Diagnosis not present

## 2023-08-26 MED ORDER — CLOBETASOL PROPIONATE 0.05 % EX SOLN: 1.0000 | CUTANEOUS | 6 refills | Status: AC

## 2023-08-26 MED ORDER — CLOBETASOL PROPIONATE 0.05 % EX OINT: TOPICAL_OINTMENT | CUTANEOUS | 5 refills | Status: AC

## 2023-08-26 MED ORDER — HUMIRA (2 PEN) 40 MG/0.4ML ~~LOC~~ AJKT: 40.0000 mg | AUTO-INJECTOR | SUBCUTANEOUS | 5 refills | Status: AC

## 2023-08-26 NOTE — Progress Notes (Unsigned)
 Follow-Up Visit   Subjective  Larry Russell is a 68 y.o. male who presents for the following: Psoriasis at scalp, legs, groin and nails. Patient advises only active areas today are small at bilateral lower leg.  Patient on Humira , has clobetasol  and Vtama  that he uses as needed.   Patient does have a spot at back that is sensitive to touch.   Patient here for full body skin exam and skin cancer screening.  The following portions of the chart were reviewed this encounter and updated as appropriate: medications, allergies, medical history  Review of Systems:  No other skin or systemic complaints except as noted in HPI or Assessment and Plan.  Objective  Well appearing patient in no apparent distress; mood and affect are within normal limits.  Areas Examined: Arms, face, scalp, trunk, legs  Relevant exam findings are noted in the Assessment and Plan.    back x 6, L clavicle x 1 Erythematous stuck-on, waxy papule or plaque  Assessment & Plan   INFLAMED SEBORRHEIC KERATOSIS back x 6, L clavicle x 1 Symptomatic, irritating, patient would like treated.  Benign-appearing.  Call clinic for new or changing lesions.   Destruction of lesion - back x 6, L clavicle x 1 Complexity: simple   Destruction method: cryotherapy   Informed consent: discussed and consent obtained   Timeout:  patient name, date of birth, surgical site, and procedure verified Lesion destroyed using liquid nitrogen: Yes   Region frozen until ice ball extended beyond lesion: Yes   Outcome: patient tolerated procedure well with no complications   Post-procedure details: wound care instructions given    PSORIASIS   Related Procedures DG Chest 2 View Related Medications Tapinarof  (VTAMA ) 1 % CREA Apply 1 application topically daily. Qd to aa psoriasis on body clobetasol  (TEMOVATE ) 0.05 % external solution Apply 1 Application topically as directed. Qd up to 5 days a week to affected area of psoriasis  scalp as needed for flares  PSORIASIS on systemic treatment with psoriatic arthritis and osteoarthritis  Well-demarcated erythematous papules/plaques with silvery scale, guttate pink scaly papules. 2% BSA on Humira .  Chronic and persistent condition with duration or expected duration over one year. Condition is improving with treatment but not currently at goal.   Counseling and coordination of care for severe psoriasis on systemic treatment  Psoriasis - severe on systemic treatment.  Psoriasis is a chronic non-curable, but treatable genetic/hereditary disease that may have other systemic features affecting other organ systems such as joints (Psoriatic Arthritis).  It is linked with heart disease, inflammatory bowel disease, non-alcoholic fatty liver disease, and depression. Significant skin psoriasis and/or psoriatic arthritis may have significant symptoms and affects activities of daily activity and often benefits from systemic treatments.  These systemic treatments have some potential side effects including immunosuppression and require pre-treatment laboratory screening and periodic laboratory monitoring and periodic in person evaluation and monitoring by the attending dermatologist physician (long term medication management).   Patient joint pain.  Treatment Plan: Cont Humira  40mg  sq injections q 2 wks Cont Clobetasol  oint qd 5d/wk prn flares Cont Clobetasol  sol qd 5d/wk aa scalp prn flares Cont Vtama  cr qd aa body prn flares  Patient's most recent labs reviewed, WNL. Will order chest x-ray. Patient does not do Quantiferon due to TB exposure.   Reviewed risks of biologics including immunosuppression, infections, injection site reaction, and failure to improve condition. Goal is control of skin condition, not cure.  Some older biologics such as Humira  and Enbrel  may slightly increase risk of malignancy and may worsen congestive heart failure.  Taltz  and Cosentyx  may cause inflammatory bowel  disease to flare. The use of biologics requires long term medication management, including periodic office visits and monitoring of blood work.   Long term medication management.  Patient is using long term (months to years) prescription medication  to control their dermatologic condition.  These medications require periodic monitoring to evaluate for efficacy and side effects and may require periodic laboratory monitoring.   Purpura - Chronic; persistent and recurrent.  Treatable, but not curable. - Violaceous macules and patches - Benign - Related to trauma, age, sun damage and/or use of blood thinners, chronic use of topical and/or oral steroids - Observe - Can use OTC arnica containing moisturizer such as Dermend Bruise Formula if desired - Call for worsening or other concerns  LENTIGINES, SEBORRHEIC KERATOSES, HEMANGIOMAS - Benign normal skin lesions - Benign-appearing - Call for any changes  MELANOCYTIC NEVI - Tan-brown and/or pink-flesh-colored symmetric macules and papules - Benign appearing on exam today - Observation - Call clinic for new or changing moles - Recommend daily use of broad spectrum spf 30+ sunscreen to sun-exposed areas.   ACTINIC DAMAGE - Chronic condition, secondary to cumulative UV/sun exposure - diffuse scaly erythematous macules with underlying dyspigmentation - Recommend daily broad spectrum sunscreen SPF 30+ to sun-exposed areas, reapply every 2 hours as needed.  - Staying in the shade or wearing long sleeves, sun glasses (UVA+UVB protection) and wide brim hats (4-inch brim around the entire circumference of the hat) are also recommended for sun protection.  - Call for new or changing lesions.  SKIN CANCER SCREENING PERFORMED TODAY    Return in about 6 months (around 02/26/2024) for Psoriasis, with Dr. LOIS Russell Larry Russell, RMA, am acting as scribe for Larry Rhyme, MD .   Documentation: I have reviewed the above documentation for accuracy and  completeness, and I agree with the above.  Larry Rhyme, MD

## 2023-08-26 NOTE — Patient Instructions (Addendum)

## 2023-08-27 ENCOUNTER — Other Ambulatory Visit: Payer: Self-pay

## 2023-09-11 DIAGNOSIS — M48062 Spinal stenosis, lumbar region with neurogenic claudication: Secondary | ICD-10-CM | POA: Diagnosis not present

## 2023-09-11 DIAGNOSIS — M5416 Radiculopathy, lumbar region: Secondary | ICD-10-CM | POA: Diagnosis not present

## 2023-09-25 ENCOUNTER — Ambulatory Visit
Admission: RE | Admit: 2023-09-25 | Discharge: 2023-09-25 | Disposition: A | Discharge status: Home / Self Care | Source: Ambulatory Visit | Attending: Dermatology | Admitting: Dermatology

## 2023-09-25 ENCOUNTER — Ambulatory Visit
Admission: RE | Admit: 2023-09-25 | Discharge: 2023-09-25 | Disposition: A | Discharge status: Home / Self Care | Attending: Dermatology | Admitting: Dermatology

## 2023-09-25 ENCOUNTER — Ambulatory Visit: Payer: Self-pay | Admitting: Podiatry

## 2023-09-25 VITALS — Ht 72.0 in | Wt 201.0 lb

## 2023-09-25 DIAGNOSIS — L409 Psoriasis, unspecified: Secondary | ICD-10-CM | POA: Insufficient documentation

## 2023-09-25 DIAGNOSIS — M65971 Unspecified synovitis and tenosynovitis, right ankle and foot: Secondary | ICD-10-CM

## 2023-09-25 DIAGNOSIS — M65972 Unspecified synovitis and tenosynovitis, left ankle and foot: Secondary | ICD-10-CM

## 2023-09-25 NOTE — Progress Notes (Signed)
  Subjective:  Patient ID: Larry Russell, male    DOB: May 25, 1955,  MRN: [id number]  Chief Complaint  Patient presents with   Injections    Rm 1 Patient is here for bilateral foot pain located on the ball of the foot. Patient is requesting injections for pain relief.    68 y.o. male presents with the above complaint. History confirmed with patient.  He returns for follow-up today still having pain in the second toe joint often get swollen when walking  Objective:  Physical Exam: warm, good capillary refill, no trophic changes or ulcerative lesions, normal DP and PT pulses, normal sensory exam, and submetatarsal 2 pain.  No instability of MTPJ.  No evidence of neuroma   Radiographs: Multiple views x-ray of both feet previously taken: no fracture, dislocation, swelling or degenerative changes noted and elongated second metatarsal Assessment:   1. Synovitis of left foot   2. Synovitis of right foot      Plan:  Patient was evaluated and treated and all questions answered.  We again reviewed the etiology and treatment options of metatarsalgia and capsulitis of the MTP joint.  There is no gross instability to suggest plantar plate tear that is active.  Orthotics and metatarsal pad support have relieved this some but have not eliminated it.  We discussed corticosteroid injection.  We discussed that there is a risk of instability with repeated injections.  He understands wished to proceed.  Following inset and sterile prep with Betadine the bilateral second MTP joint was injected separately with 10 mg of Kenalog and 5 mg of Marcaine .  He tolerated it well and it was dressed with a bandage.  Follow-up as needed.  No follow-ups on file.

## 2023-10-02 ENCOUNTER — Ambulatory Visit: Payer: Self-pay | Admitting: Dermatology

## 2023-10-02 DIAGNOSIS — L409 Psoriasis, unspecified: Secondary | ICD-10-CM

## 2023-10-02 NOTE — Telephone Encounter (Addendum)
 Tried calling patient. No answer. LM for patient to return call.  ----- Message from Larry Russell sent at 10/02/2023 12:30 PM EDT ----- CXR from 10/02/2023 showed  Normal X-ray.  Continue Humira  for Psoriasis If pt needs refills to make January 2026 appt, please send. Keep follow up appt Please advise pt ----- Message ----- From: Interface, Rad Results In Sent: 10/02/2023  11:18 AM EDT To: Larry JAYSON Rhyme, MD

## 2023-10-04 DIAGNOSIS — M48062 Spinal stenosis, lumbar region with neurogenic claudication: Secondary | ICD-10-CM | POA: Diagnosis not present

## 2023-10-04 DIAGNOSIS — M5416 Radiculopathy, lumbar region: Secondary | ICD-10-CM | POA: Diagnosis not present

## 2023-10-04 DIAGNOSIS — M5126 Other intervertebral disc displacement, lumbar region: Secondary | ICD-10-CM | POA: Diagnosis not present

## 2023-10-14 NOTE — Telephone Encounter (Addendum)
 Patient notified of x-ray results.  Patient advised to keep follow up.    ----- Message from Alm Rhyme sent at 10/02/2023 12:30 PM EDT ----- CXR from 10/02/2023 showed  Normal X-ray.  Continue Humira  for Psoriasis If pt needs refills to make January 2026 appt, please send. Keep follow up appt Please advise pt ----- Message ----- From: Interface, Rad Results In Sent: 10/02/2023  11:18 AM EDT To: Alm JAYSON Rhyme, MD

## 2023-10-23 DIAGNOSIS — E538 Deficiency of other specified B group vitamins: Secondary | ICD-10-CM | POA: Diagnosis not present

## 2023-10-23 DIAGNOSIS — R739 Hyperglycemia, unspecified: Secondary | ICD-10-CM | POA: Diagnosis not present

## 2023-10-23 DIAGNOSIS — E782 Mixed hyperlipidemia: Secondary | ICD-10-CM | POA: Diagnosis not present

## 2023-10-23 DIAGNOSIS — Z125 Encounter for screening for malignant neoplasm of prostate: Secondary | ICD-10-CM | POA: Diagnosis not present

## 2023-11-04 ENCOUNTER — Other Ambulatory Visit (HOSPITAL_COMMUNITY): Payer: Self-pay

## 2023-11-06 DIAGNOSIS — Z125 Encounter for screening for malignant neoplasm of prostate: Secondary | ICD-10-CM | POA: Diagnosis not present

## 2023-11-06 DIAGNOSIS — E538 Deficiency of other specified B group vitamins: Secondary | ICD-10-CM | POA: Diagnosis not present

## 2023-11-06 DIAGNOSIS — E782 Mixed hyperlipidemia: Secondary | ICD-10-CM | POA: Diagnosis not present

## 2023-11-06 DIAGNOSIS — R739 Hyperglycemia, unspecified: Secondary | ICD-10-CM | POA: Diagnosis not present

## 2023-11-06 DIAGNOSIS — L4050 Arthropathic psoriasis, unspecified: Secondary | ICD-10-CM | POA: Diagnosis not present

## 2023-11-06 DIAGNOSIS — Z0000 Encounter for general adult medical examination without abnormal findings: Secondary | ICD-10-CM | POA: Diagnosis not present

## 2023-11-08 ENCOUNTER — Other Ambulatory Visit (HOSPITAL_COMMUNITY): Payer: Self-pay

## 2023-11-08 ENCOUNTER — Other Ambulatory Visit: Payer: Self-pay

## 2023-11-08 MED ORDER — GABAPENTIN 300 MG PO CAPS
600.0000 mg | ORAL_CAPSULE | Freq: Every day | ORAL | 3 refills | Status: AC
  Filled 2023-11-08: qty 180, 90d supply, fill #0
  Filled 2024-01-21: qty 180, 90d supply, fill #1

## 2024-01-01 DIAGNOSIS — M9903 Segmental and somatic dysfunction of lumbar region: Secondary | ICD-10-CM | POA: Diagnosis not present

## 2024-01-01 DIAGNOSIS — M5432 Sciatica, left side: Secondary | ICD-10-CM | POA: Diagnosis not present

## 2024-01-01 DIAGNOSIS — M4306 Spondylolysis, lumbar region: Secondary | ICD-10-CM | POA: Diagnosis not present

## 2024-01-01 DIAGNOSIS — M9905 Segmental and somatic dysfunction of pelvic region: Secondary | ICD-10-CM | POA: Diagnosis not present

## 2024-01-06 DIAGNOSIS — M5432 Sciatica, left side: Secondary | ICD-10-CM | POA: Diagnosis not present

## 2024-01-06 DIAGNOSIS — M9905 Segmental and somatic dysfunction of pelvic region: Secondary | ICD-10-CM | POA: Diagnosis not present

## 2024-01-06 DIAGNOSIS — M4306 Spondylolysis, lumbar region: Secondary | ICD-10-CM | POA: Diagnosis not present

## 2024-01-06 DIAGNOSIS — M9903 Segmental and somatic dysfunction of lumbar region: Secondary | ICD-10-CM | POA: Diagnosis not present

## 2024-01-09 DIAGNOSIS — M9905 Segmental and somatic dysfunction of pelvic region: Secondary | ICD-10-CM | POA: Diagnosis not present

## 2024-01-09 DIAGNOSIS — M4306 Spondylolysis, lumbar region: Secondary | ICD-10-CM | POA: Diagnosis not present

## 2024-01-09 DIAGNOSIS — M5432 Sciatica, left side: Secondary | ICD-10-CM | POA: Diagnosis not present

## 2024-01-09 DIAGNOSIS — M9903 Segmental and somatic dysfunction of lumbar region: Secondary | ICD-10-CM | POA: Diagnosis not present

## 2024-01-13 DIAGNOSIS — M9905 Segmental and somatic dysfunction of pelvic region: Secondary | ICD-10-CM | POA: Diagnosis not present

## 2024-01-13 DIAGNOSIS — M5432 Sciatica, left side: Secondary | ICD-10-CM | POA: Diagnosis not present

## 2024-01-13 DIAGNOSIS — M9903 Segmental and somatic dysfunction of lumbar region: Secondary | ICD-10-CM | POA: Diagnosis not present

## 2024-01-13 DIAGNOSIS — M4306 Spondylolysis, lumbar region: Secondary | ICD-10-CM | POA: Diagnosis not present

## 2024-01-21 ENCOUNTER — Other Ambulatory Visit: Payer: Self-pay

## 2024-02-05 ENCOUNTER — Other Ambulatory Visit (HOSPITAL_COMMUNITY): Payer: Self-pay

## 2024-02-17 ENCOUNTER — Other Ambulatory Visit (HOSPITAL_COMMUNITY): Payer: Self-pay

## 2024-02-19 ENCOUNTER — Other Ambulatory Visit (HOSPITAL_COMMUNITY): Payer: Self-pay

## 2024-02-19 ENCOUNTER — Other Ambulatory Visit: Payer: Self-pay

## 2024-02-19 MED ORDER — IBUPROFEN 600 MG PO TABS
600.0000 mg | ORAL_TABLET | Freq: Four times a day (QID) | ORAL | 3 refills | Status: AC | PRN
  Filled 2024-02-19: qty 100, 25d supply, fill #0

## 2024-03-03 ENCOUNTER — Encounter: Payer: Self-pay | Admitting: Dermatology

## 2024-03-03 ENCOUNTER — Ambulatory Visit: Admitting: Dermatology

## 2024-03-03 DIAGNOSIS — L739 Follicular disorder, unspecified: Secondary | ICD-10-CM

## 2024-03-03 DIAGNOSIS — B356 Tinea cruris: Secondary | ICD-10-CM

## 2024-03-03 DIAGNOSIS — Z7189 Other specified counseling: Secondary | ICD-10-CM | POA: Diagnosis not present

## 2024-03-03 DIAGNOSIS — Z79899 Other long term (current) drug therapy: Secondary | ICD-10-CM

## 2024-03-03 DIAGNOSIS — L4050 Arthropathic psoriasis, unspecified: Secondary | ICD-10-CM | POA: Diagnosis not present

## 2024-03-03 DIAGNOSIS — B353 Tinea pedis: Secondary | ICD-10-CM

## 2024-03-03 DIAGNOSIS — L409 Psoriasis, unspecified: Secondary | ICD-10-CM

## 2024-03-03 MED ORDER — CLINDAMYCIN PHOSPHATE 1 % EX SOLN: CUTANEOUS | 6 refills | Status: AC

## 2024-03-03 MED ORDER — TREMFYA 100 MG/ML ~~LOC~~ SOSY: 100.0000 mg | PREFILLED_SYRINGE | SUBCUTANEOUS | 4 refills | Status: AC

## 2024-03-03 MED ORDER — TERBINAFINE HCL 250 MG PO TABS: 250.0000 mg | ORAL_TABLET | Freq: Every day | ORAL | 0 refills | Status: AC

## 2024-03-03 NOTE — Patient Instructions (Signed)

## 2024-03-03 NOTE — Progress Notes (Signed)
" ° °  Follow-Up Visit   Subjective  Larry Russell is a 69 y.o. male who presents for the following: 6 months f/u on Psoriasis, taking Humira  injection every 2 weeks with a fair response, patient report flare of guttate psoriasis on his chest and back  The following portions of the chart were reviewed this encounter and updated as appropriate: medications, allergies, medical history  Review of Systems:  No other skin or systemic complaints except as noted in HPI or Assessment and Plan.  Objective  Well appearing patient in no apparent distress; mood and affect are within normal limits.  Areas Examined: Face,scalp,chest,back,groin  Relevant exam findings are noted in the Assessment and Plan.      Assessment & Plan   FOLLICULITIS Posterior scalp Exam: crusty red papules on the posterior scalp near hairline Folliculitis occurs due to inflammation of the superficial hair follicle (pore), resulting in acne-like lesions (pus bumps). It can be infectious (bacterial, fungal) or noninfectious (shaving, tight clothing, heat/sweat, medications).  Folliculitis can be acute or chronic and recommended treatment depends on the underlying cause of folliculitis. Treatment Plan: Start Clindamycin  solution apply to scalp once or twice a day     TINEA PEDIS/TINEA CRURIS Feet and groin  Exam: Scaling and maceration web spaces and over distal and lateral soles and groin Treatment Plan: LFT's reviewed from Sept 2025- normal results  Start Terbinafine  250 mg take 1 tablet once a day #30 0RF  Patient instructed to send a Mychart message in 2 months with update on progress    PSORIASIS on systemic treatment With Psoriatic Arthritis, and  Osteoarthritis And recent flare withGuttate psoriasis -  Legs, scalp, groin, nails Exam: scalp body clear today 10 % BSA on treatment Chronic and persistent condition with duration or expected duration over one year. Condition is bothersome/symptomatic for  patient. Currently flared.    Counseling on psoriasis and coordination of care  psoriasis is a chronic non-curable, but treatable genetic/hereditary disease that may have other systemic features affecting other organ systems such as joints (Psoriatic Arthritis). It is associated with an increased risk of inflammatory bowel disease, heart disease, non-alcoholic fatty liver disease, and depression.  Treatments include light and laser treatments; topical medications; and systemic medications including oral and injectables.  Continue Humira  40 mg injection q 2 weeks   Plan on changing from Humira  to Tremyfa pending insurance approval and affordability Tremfya  100 mg inject at week 0 & 4 then inject Tremyfa 100 mg every 8 weeks   Reviewed risks of biologics including immunosuppression, infections, injection site reaction, and failure to improve condition. Goal is control of skin condition, not cure.  Some older biologics such as Humira  and Enbrel may slightly increase risk of malignancy and may worsen congestive heart failure.  Taltz  and Cosentyx  may cause inflammatory bowel disease to flare. The use of biologics requires long term medication management, including periodic office visits and monitoring of blood work.    Long term medication management.  Patient is using long term (months to years) prescription medication  to control their dermatologic condition.  These medications require periodic monitoring to evaluate for efficacy and side effects and may require periodic laboratory monitoring.     Return in about 6 months (around 08/31/2024) for Psoriasis.  IFay Kirks, CMA, am acting as scribe for Alm Rhyme, MD .   Documentation: I have reviewed the above documentation for accuracy and completeness, and I agree with the above.  Alm Rhyme, MD  "

## 2024-03-09 ENCOUNTER — Other Ambulatory Visit: Payer: Self-pay

## 2024-03-09 MED ORDER — TREMFYA ONE-PRESS 100 MG/ML ~~LOC~~ SOPN: 100.0000 mg | PEN_INJECTOR | SUBCUTANEOUS | 2 refills | Status: AC

## 2024-03-10 ENCOUNTER — Telehealth: Payer: Self-pay

## 2024-03-10 NOTE — Telephone Encounter (Signed)
 Patient called and left a message inquiring about the new medication that Dr. Hester wanted him to start for his psoriasis.  Called patient back and advised the new medication for his psoriasis Trenfya was approved.  Advised pt I would leave Tremfya  patient assistance paperwork at front desk for him to sign and we would fax to see if he can get any copay assistance.  Patient would like for us  to send the Humira  copay assistance paperwork in just incase the Tremfya  is too expensive and he needs to stay on Humira .  I advised Alan will send that paperwork in.  Patient will continue Humira  for now until he figures out Tremfya  cost/assistance./sh

## 2024-09-10 ENCOUNTER — Ambulatory Visit: Admitting: Dermatology
